# Patient Record
Sex: Male | Born: 1975
Health system: Southern US, Community
[De-identification: ages and names within clinical notes are randomized; demographics above are authoritative.]

## PROBLEM LIST (undated history)

## (undated) DIAGNOSIS — G809 Cerebral palsy, unspecified: Secondary | ICD-10-CM

## (undated) HISTORY — PX: FASCIAL DEFECT REPAIR: SHX865

## (undated) HISTORY — DX: Cerebral palsy, unspecified: G80.9

---

## 2013-10-09 ENCOUNTER — Encounter: Payer: Self-pay | Admitting: Family Medicine

## 2013-10-09 ENCOUNTER — Ambulatory Visit (INDEPENDENT_AMBULATORY_CARE_PROVIDER_SITE_OTHER): Payer: 59 | Admitting: Family Medicine

## 2013-10-09 VITALS — BP 135/93 | HR 89 | Ht 66.0 in | Wt 168.0 lb

## 2013-10-09 DIAGNOSIS — M545 Low back pain, unspecified: Secondary | ICD-10-CM | POA: Insufficient documentation

## 2013-10-09 DIAGNOSIS — G809 Cerebral palsy, unspecified: Secondary | ICD-10-CM

## 2013-10-09 DIAGNOSIS — R5382 Chronic fatigue, unspecified: Secondary | ICD-10-CM | POA: Insufficient documentation

## 2013-10-09 DIAGNOSIS — R5381 Other malaise: Secondary | ICD-10-CM

## 2013-10-09 DIAGNOSIS — R5383 Other fatigue: Secondary | ICD-10-CM

## 2013-10-09 HISTORY — DX: Cerebral palsy, unspecified: G80.9

## 2013-10-09 NOTE — Progress Notes (Signed)
CC: Jonathan Rivera is a 38 y.o. male is here for Establish Care and wants disability forms filled   Subjective: HPI:  Very pleasant 38 year old here to establish care  Patient complains of chronic fatigue has been present for matter of months it has not been getting better or worse it is persistent on a daily basis. He reports restorative sleep however within hours of casual daily activities he will be fatigue to the point where he has to nap. He denies difficulty falling or staying asleep. Describes symptoms as moderate in severity occurring anytime of the day. He denies snoring, and there have been no witnessed apneic episodes. Denies shortness of breath or chest pain  He is a history of cerebral palsy and is currently on disability. He has paperwork is requesting that I fill out for him today to help with student loan reimbursement.  He also brings in a evaluation confirming fine motor, gross motor and expressive language deficits that have been present since childhood.  He complains of midline low back pain that has been present for about a year but worsening over the last month. It is present on a daily basis described only has pain in nonradiating. Denies radiation, bowel or bladder incontinence or saddle paresthesia. Denies recent or remote trauma. He has lower extremity spasticity along with weakness due to cerebral palsy and notes that the more he walks with a slight stiff the worse his back pain is. No interventions as of yet and he takes no medications for this   Review of Systems - General ROS: negative for - chills, fever, night sweats, weight gain or weight loss Ophthalmic ROS: negative for - decreased vision Psychological ROS: negative for - anxiety or depression ENT ROS: negative for - hearing change, nasal congestion, tinnitus or allergies Hematological and Lymphatic ROS: negative for - bleeding problems, bruising or swollen lymph nodes Breast ROS: negative Respiratory ROS: no  cough, shortness of breath, or wheezing Cardiovascular ROS: no chest pain or dyspnea on exertion Gastrointestinal ROS: no abdominal pain, change in bowel habits, or black or bloody stools Genito-Urinary ROS: negative for - genital discharge, genital ulcers, incontinence or abnormal bleeding from genitals Musculoskeletal ROS: negative for -positive for bilateral ankle and knee pain Neurological ROS: negative for - headaches or memory loss Dermatological ROS: negative for lumps, mole changes, rash and skin lesion changes  Past Medical History  Diagnosis Date  . CP (cerebral palsy) 10/09/2013    Fine motor, gross motor, expressive language.     No past surgical history on file. No family history on file.  History   Social History  . Marital Status: Single    Spouse Name: N/A    Number of Children: N/A  . Years of Education: N/A   Occupational History  . Not on file.   Social History Main Topics  . Smoking status: Never Smoker   . Smokeless tobacco: Not on file  . Alcohol Use: 0.5 oz/week    1 drink(s) per week  . Drug Use: No  . Sexual Activity: Yes    Partners: Female   Other Topics Concern  . Not on file   Social History Narrative  . No narrative on file     Objective: BP 135/93  Pulse 89  Ht 5' 6"  (1.676 m)  Wt 168 lb (76.204 kg)  BMI 27.13 kg/m2  General: Alert and Oriented, No Acute Distress HEENT: Pupils equal, round, reactive to light. Conjunctivae clear.  External ears unremarkable, canals clear with intact  TMs with appropriate landmarks.  Middle ear appears open without effusion. Pink inferior turbinates.  Moist mucous membranes, pharynx without inflammation nor lesions.  Neck supple without palpable lymphadenopathy nor abnormal masses. Lungs: Clear to auscultation bilaterally, no wheezing/ronchi/rales.  Comfortable work of breathing. Good air movement. Cardiac: Regular rate and rhythm. Normal S1/S2.  No murmurs, rubs, nor gallops.   Back: No midline spinous  process tenderness or paraspinal muscular tenderness in the lumbar region. Significant straightening of the lumbar spine. Negative straight leg raise. L4 and S1 DTRs two over four bilaterally. Extremities: No peripheral edema.  Strong peripheral pulses. Moderate atrophy of the thigh and calf muscles bilaterally. For over 5 strength throughout all muscle groups in the legs distally to the foot Mental Status: No depression, anxiety, nor agitation. Skin: Warm and dry.  Assessment & Plan: [name] was seen today for establish care and wants disability forms filled.  Diagnoses and associated orders for this visit:  CP (cerebral palsy) - Ambulatory referral to Physical Therapy  Chronic fatigue - CBC - Vit D  25 hydroxy (rtn osteoporosis monitoring) - Comp Met (CMET) - B12  Midline low back pain without sciatica - Ambulatory referral to Physical Therapy    Cerebral palsy: Suspect that poor biomechanics secondary to cerebral palsy has been contributing to his low back pain, joint decision that is not maximized on his physical conditioning therefore referral to physical therapy to help maximize his independence. Of note he is dependent on others to help with dressing but independent of all other ADLs Chronic fatigue: Checking labs above Time was taken to fill out and review his disability paperwork in the presence of the patient  45 minutes spent face-to-face during visit today of which at least 50% was counseling or coordinating care regarding: 1. CP (cerebral palsy)   2. Chronic fatigue   3. Midline low back pain without sciatica      Return in about 4 weeks (around 11/06/2013).

## 2013-10-10 LAB — COMPREHENSIVE METABOLIC PANEL
ALT: 67 U/L — AB (ref 0–53)
AST: 26 U/L (ref 0–37)
Albumin: 4.7 g/dL (ref 3.5–5.2)
Alkaline Phosphatase: 87 U/L (ref 39–117)
BILIRUBIN TOTAL: 0.9 mg/dL (ref 0.2–1.2)
BUN: 13 mg/dL (ref 6–23)
CALCIUM: 9.6 mg/dL (ref 8.4–10.5)
CHLORIDE: 101 meq/L (ref 96–112)
CO2: 27 mEq/L (ref 19–32)
Creat: 0.81 mg/dL (ref 0.50–1.35)
GLUCOSE: 193 mg/dL — AB (ref 70–99)
POTASSIUM: 4.1 meq/L (ref 3.5–5.3)
SODIUM: 136 meq/L (ref 135–145)
TOTAL PROTEIN: 7.2 g/dL (ref 6.0–8.3)

## 2013-10-10 LAB — CBC
HEMATOCRIT: 44.6 % (ref 39.0–52.0)
HEMOGLOBIN: 15.4 g/dL (ref 13.0–17.0)
MCH: 28.5 pg (ref 26.0–34.0)
MCHC: 34.5 g/dL (ref 30.0–36.0)
MCV: 82.6 fL (ref 78.0–100.0)
Platelets: 244 10*3/uL (ref 150–400)
RBC: 5.4 MIL/uL (ref 4.22–5.81)
RDW: 13.9 % (ref 11.5–15.5)
WBC: 9.1 10*3/uL (ref 4.0–10.5)

## 2013-10-10 LAB — VITAMIN D 25 HYDROXY (VIT D DEFICIENCY, FRACTURES): VIT D 25 HYDROXY: 12 ng/mL — AB (ref 30–89)

## 2013-10-10 LAB — VITAMIN B12: Vitamin B-12: 580 pg/mL (ref 211–911)

## 2013-10-12 ENCOUNTER — Encounter: Payer: Self-pay | Admitting: Family Medicine

## 2013-10-12 ENCOUNTER — Telehealth: Payer: Self-pay | Admitting: Family Medicine

## 2013-10-12 DIAGNOSIS — E559 Vitamin D deficiency, unspecified: Secondary | ICD-10-CM | POA: Insufficient documentation

## 2013-10-12 DIAGNOSIS — R739 Hyperglycemia, unspecified: Secondary | ICD-10-CM

## 2013-10-12 MED ORDER — VITAMIN D (ERGOCALCIFEROL) 1.25 MG (50000 UNIT) PO CAPS: 50000.0000 [IU] | ORAL_CAPSULE | ORAL | Status: DC

## 2013-10-12 NOTE — Telephone Encounter (Signed)
Jonathan Rivera, Will you please let patient know that his labs were significant for a Vitamin D deficiency along with an elevated blood sugar both of which could be the cause of his fatigue.  I've sent in a Rx for a weekly Vitamin D supplement to [facility] and I'd recommend that he return for a lab only visit to have an A1c average blood sugar check to look for diabetes. (lab in your inbox)

## 2013-10-12 NOTE — Telephone Encounter (Signed)
Pt notified and labs faxed 

## 2013-10-28 ENCOUNTER — Ambulatory Visit (INDEPENDENT_AMBULATORY_CARE_PROVIDER_SITE_OTHER): Payer: 59 | Admitting: Physical Therapy

## 2013-10-28 DIAGNOSIS — M25579 Pain in unspecified ankle and joints of unspecified foot: Secondary | ICD-10-CM

## 2013-10-28 DIAGNOSIS — M545 Low back pain, unspecified: Secondary | ICD-10-CM

## 2013-10-28 DIAGNOSIS — R5381 Other malaise: Secondary | ICD-10-CM

## 2013-10-28 DIAGNOSIS — G809 Cerebral palsy, unspecified: Secondary | ICD-10-CM

## 2013-10-28 DIAGNOSIS — M2560 Stiffness of unspecified joint, not elsewhere classified: Secondary | ICD-10-CM

## 2013-10-29 ENCOUNTER — Telehealth: Payer: Self-pay | Admitting: Family Medicine

## 2013-10-29 DIAGNOSIS — E119 Type 2 diabetes mellitus without complications: Secondary | ICD-10-CM

## 2013-10-29 LAB — HEMOGLOBIN A1C
Hgb A1c MFr Bld: 8.5 % — ABNORMAL HIGH (ref <5.7)
Mean Plasma Glucose: 197 mg/dL — ABNORMAL HIGH (ref <117)

## 2013-10-29 MED ORDER — METFORMIN HCL 1000 MG PO TABS: ORAL_TABLET | ORAL | Status: DC

## 2013-10-29 NOTE — Telephone Encounter (Signed)
[name], Will you please let patient know that his A1c average blood sugar test is in the diabetic range.  To help better control this and prevent complications I'd recommend he start a daily dose of metformin which I've [facility].  We'll want to recheck this along with his Vitamin D in late November.

## 2013-10-29 NOTE — Telephone Encounter (Signed)
Pt.notified

## 2013-11-09 ENCOUNTER — Encounter (INDEPENDENT_AMBULATORY_CARE_PROVIDER_SITE_OTHER): Payer: 59 | Admitting: Physical Therapy

## 2013-11-09 DIAGNOSIS — G809 Cerebral palsy, unspecified: Secondary | ICD-10-CM

## 2013-11-09 DIAGNOSIS — M545 Low back pain, unspecified: Secondary | ICD-10-CM

## 2013-11-09 DIAGNOSIS — M25579 Pain in unspecified ankle and joints of unspecified foot: Secondary | ICD-10-CM

## 2013-11-09 DIAGNOSIS — M2560 Stiffness of unspecified joint, not elsewhere classified: Secondary | ICD-10-CM

## 2013-11-09 DIAGNOSIS — R5381 Other malaise: Secondary | ICD-10-CM

## 2013-11-12 ENCOUNTER — Encounter (INDEPENDENT_AMBULATORY_CARE_PROVIDER_SITE_OTHER): Payer: 59 | Admitting: Physical Therapy

## 2013-11-12 DIAGNOSIS — M2560 Stiffness of unspecified joint, not elsewhere classified: Secondary | ICD-10-CM

## 2013-11-12 DIAGNOSIS — M25579 Pain in unspecified ankle and joints of unspecified foot: Secondary | ICD-10-CM

## 2013-11-12 DIAGNOSIS — G809 Cerebral palsy, unspecified: Secondary | ICD-10-CM

## 2013-11-12 DIAGNOSIS — M545 Low back pain, unspecified: Secondary | ICD-10-CM

## 2013-11-12 DIAGNOSIS — R5381 Other malaise: Secondary | ICD-10-CM

## 2013-11-16 ENCOUNTER — Encounter (INDEPENDENT_AMBULATORY_CARE_PROVIDER_SITE_OTHER): Payer: 59 | Admitting: Physical Therapy

## 2013-11-16 DIAGNOSIS — M25579 Pain in unspecified ankle and joints of unspecified foot: Secondary | ICD-10-CM

## 2013-11-16 DIAGNOSIS — G809 Cerebral palsy, unspecified: Secondary | ICD-10-CM

## 2013-11-16 DIAGNOSIS — R5381 Other malaise: Secondary | ICD-10-CM

## 2013-11-16 DIAGNOSIS — M2560 Stiffness of unspecified joint, not elsewhere classified: Secondary | ICD-10-CM

## 2013-11-16 DIAGNOSIS — M545 Low back pain, unspecified: Secondary | ICD-10-CM

## 2013-11-19 ENCOUNTER — Encounter: Payer: 59 | Admitting: Physical Therapy

## 2013-11-23 ENCOUNTER — Encounter: Payer: 59 | Admitting: Physical Therapy

## 2013-11-26 ENCOUNTER — Encounter: Payer: 59 | Admitting: Physical Therapy

## 2013-11-30 ENCOUNTER — Encounter: Payer: Self-pay | Admitting: Physician Assistant

## 2013-11-30 ENCOUNTER — Encounter (INDEPENDENT_AMBULATORY_CARE_PROVIDER_SITE_OTHER): Payer: 59 | Admitting: Physical Therapy

## 2013-11-30 ENCOUNTER — Ambulatory Visit (INDEPENDENT_AMBULATORY_CARE_PROVIDER_SITE_OTHER): Payer: 59 | Admitting: Physician Assistant

## 2013-11-30 VITALS — BP 128/95 | HR 95

## 2013-11-30 DIAGNOSIS — M545 Low back pain, unspecified: Secondary | ICD-10-CM

## 2013-11-30 DIAGNOSIS — M62830 Muscle spasm of back: Secondary | ICD-10-CM

## 2013-11-30 DIAGNOSIS — M2560 Stiffness of unspecified joint, not elsewhere classified: Secondary | ICD-10-CM

## 2013-11-30 DIAGNOSIS — M25579 Pain in unspecified ankle and joints of unspecified foot: Secondary | ICD-10-CM

## 2013-11-30 DIAGNOSIS — G809 Cerebral palsy, unspecified: Secondary | ICD-10-CM

## 2013-11-30 MED ORDER — MELOXICAM 15 MG PO TABS: 15.0000 mg | ORAL_TABLET | Freq: Every day | ORAL | Status: DC

## 2013-11-30 MED ORDER — TRAMADOL HCL 50 MG PO TABS
50.0000 mg | ORAL_TABLET | Freq: Three times a day (TID) | ORAL | Status: DC | PRN
Start: 2013-11-30 — End: 2017-10-24

## 2013-11-30 MED ORDER — KETOROLAC TROMETHAMINE 60 MG/2ML IM SOLN
60.0000 mg | Freq: Once | INTRAMUSCULAR | Status: AC
  Administered 2013-11-30: 60 mg via INTRAMUSCULAR

## 2013-11-30 MED ORDER — CYCLOBENZAPRINE HCL 10 MG PO TABS: 10.0000 mg | ORAL_TABLET | Freq: Three times a day (TID) | ORAL | Status: DC | PRN

## 2013-11-30 NOTE — Patient Instructions (Signed)
Toradol in office today.  Flexeril as needed up to three times a day.  Mobic daily for next week.  Tramadol as needed for pain.  Heat and ice alternate.  ROM exercise given by PT.

## 2013-11-30 NOTE — Progress Notes (Signed)
   Subjective:    Patient ID: Jonathan Rivera, male    DOB: 11/27/1975, 38 y.o.   MRN: [id number]  HPI Pt presents to the clinic with lower midline back pain that worsened this am about 12. He has been in PT for one month with chronic midline back pain without sciatica. He does feel like this was helping. This am he sat up and bed and felt something "twist" and acute pain followed. Rated pain last night 9/10. Now rates 7/10. Movement makes worse. He has to use cane and/or wheelchair to get around. Feels better to lay in fetal position or lean forward. Denies any radiation of pain down the backs of legs but does have some muscle tightness down the front of his leg. No trauma.    Review of Systems  All other systems reviewed and are negative.      Objective:   Physical Exam  Constitutional: He is oriented to person, place, and time. He appears well-developed and well-nourished.  HENT:  Head: Normocephalic and atraumatic.  Musculoskeletal:  ROM at waist limited due to pain.  Pain better leaning forward.  No pain over spine to palpation.  Lumbar Paraspinous muscles tight and tender.  Not able to perform straight leg test due to pain.  Not able to balance on one leg.   Neurological: He is alert and oriented to person, place, and time.  Skin: Skin is dry.  Psychiatric: He has a normal mood and affect. His behavior is normal.          Assessment & Plan:  Muscle spasm of back/midline low back pain without scaiatica- pt had tremendously tight lower Paraspinous muscles. Toradol 60mg  IM given in office today. Flexeril to use up to three times a day given. Sedation warning discussed. mobic to take daily for 1-2 weeks.(pt requested once a day because hates taking medication). Tramadol given for acute pain #30. Ice and heat alternated encouraged. Follow up as needed or if pain worsening. Discussed with pt since no trauma or red flag signs then no imaging needed at this point. Follow up with PCP for  further management. Continue with PT.

## 2013-12-03 ENCOUNTER — Encounter (INDEPENDENT_AMBULATORY_CARE_PROVIDER_SITE_OTHER): Payer: 59

## 2013-12-03 DIAGNOSIS — G809 Cerebral palsy, unspecified: Secondary | ICD-10-CM

## 2013-12-03 DIAGNOSIS — M2560 Stiffness of unspecified joint, not elsewhere classified: Secondary | ICD-10-CM

## 2013-12-03 DIAGNOSIS — M542 Cervicalgia: Secondary | ICD-10-CM

## 2013-12-03 DIAGNOSIS — M25579 Pain in unspecified ankle and joints of unspecified foot: Secondary | ICD-10-CM

## 2013-12-03 DIAGNOSIS — R5381 Other malaise: Secondary | ICD-10-CM

## 2013-12-10 ENCOUNTER — Encounter: Payer: 59 | Admitting: Physical Therapy

## 2013-12-17 ENCOUNTER — Encounter (INDEPENDENT_AMBULATORY_CARE_PROVIDER_SITE_OTHER): Payer: 59 | Admitting: Physical Therapy

## 2013-12-17 DIAGNOSIS — M542 Cervicalgia: Secondary | ICD-10-CM

## 2013-12-17 DIAGNOSIS — M25579 Pain in unspecified ankle and joints of unspecified foot: Secondary | ICD-10-CM

## 2013-12-17 DIAGNOSIS — M2560 Stiffness of unspecified joint, not elsewhere classified: Secondary | ICD-10-CM

## 2013-12-17 DIAGNOSIS — R5381 Other malaise: Secondary | ICD-10-CM

## 2013-12-17 DIAGNOSIS — M549 Dorsalgia, unspecified: Secondary | ICD-10-CM

## 2013-12-17 DIAGNOSIS — G809 Cerebral palsy, unspecified: Secondary | ICD-10-CM

## 2013-12-31 ENCOUNTER — Encounter (INDEPENDENT_AMBULATORY_CARE_PROVIDER_SITE_OTHER): Payer: 59 | Admitting: Physical Therapy

## 2013-12-31 DIAGNOSIS — M545 Low back pain: Secondary | ICD-10-CM

## 2013-12-31 DIAGNOSIS — M25579 Pain in unspecified ankle and joints of unspecified foot: Secondary | ICD-10-CM

## 2013-12-31 DIAGNOSIS — G809 Cerebral palsy, unspecified: Secondary | ICD-10-CM

## 2013-12-31 DIAGNOSIS — R5381 Other malaise: Secondary | ICD-10-CM

## 2013-12-31 DIAGNOSIS — M2560 Stiffness of unspecified joint, not elsewhere classified: Secondary | ICD-10-CM

## 2014-01-14 ENCOUNTER — Encounter: Payer: 59 | Admitting: Physical Therapy

## 2014-01-14 ENCOUNTER — Encounter (INDEPENDENT_AMBULATORY_CARE_PROVIDER_SITE_OTHER): Payer: 59 | Admitting: Physical Therapy

## 2014-01-14 DIAGNOSIS — M2560 Stiffness of unspecified joint, not elsewhere classified: Secondary | ICD-10-CM

## 2014-01-14 DIAGNOSIS — G809 Cerebral palsy, unspecified: Secondary | ICD-10-CM

## 2014-01-14 DIAGNOSIS — M25579 Pain in unspecified ankle and joints of unspecified foot: Secondary | ICD-10-CM

## 2014-01-14 DIAGNOSIS — M545 Low back pain: Secondary | ICD-10-CM

## 2014-01-14 DIAGNOSIS — R5381 Other malaise: Secondary | ICD-10-CM

## 2014-02-03 ENCOUNTER — Other Ambulatory Visit: Payer: Self-pay | Admitting: Family Medicine

## 2014-02-03 NOTE — Telephone Encounter (Signed)
Dr. Ivan AnchorsHommel do you want Vitamin D level prior to this refill? Corliss SkainsJamie Natasa Stigall, CMA

## 2014-02-03 NOTE — Telephone Encounter (Signed)
Yes he's due for a Vit D check and also a f/u appt with me.

## 2017-10-03 ENCOUNTER — Encounter: Payer: Self-pay | Admitting: Osteopathic Medicine

## 2017-10-03 ENCOUNTER — Ambulatory Visit (INDEPENDENT_AMBULATORY_CARE_PROVIDER_SITE_OTHER): Payer: Medicare HMO | Admitting: Osteopathic Medicine

## 2017-10-03 VITALS — BP 141/99 | HR 90 | Temp 98.1°F | Ht 67.0 in | Wt 160.0 lb

## 2017-10-03 DIAGNOSIS — R7989 Other specified abnormal findings of blood chemistry: Secondary | ICD-10-CM | POA: Diagnosis not present

## 2017-10-03 DIAGNOSIS — Z0000 Encounter for general adult medical examination without abnormal findings: Secondary | ICD-10-CM | POA: Diagnosis not present

## 2017-10-03 DIAGNOSIS — R7309 Other abnormal glucose: Secondary | ICD-10-CM

## 2017-10-03 DIAGNOSIS — Z8669 Personal history of other diseases of the nervous system and sense organs: Secondary | ICD-10-CM | POA: Diagnosis not present

## 2017-10-03 DIAGNOSIS — R030 Elevated blood-pressure reading, without diagnosis of hypertension: Secondary | ICD-10-CM | POA: Diagnosis not present

## 2017-10-03 DIAGNOSIS — Z113 Encounter for screening for infections with a predominantly sexual mode of transmission: Secondary | ICD-10-CM | POA: Diagnosis not present

## 2017-10-03 NOTE — Progress Notes (Signed)
HPI: Jonathan Rivera is a 42 y.o. male who  has a past medical history of CP (cerebral palsy) (HCC) (10/09/2013).  he presents to [facility] today, 10/03/17,  for chief complaint of: Establish care Cerebral palsy History diabetes    Previously seen by Dr Ivan Anchors and Lesly Rubenstein 2015.  Was never told about diabetes-range sugars that he can recall.   Concerned about preventive care since he just turned 42 years old. No other major concerns right now.   There is no immunization history on file for this patient.    Past medical, surgical, social and family history reviewed:  Patient Active Problem List   Diagnosis Date Noted  . Type 2 diabetes mellitus (HCC) 10/29/2013  . Vitamin D deficiency 10/12/2013  . CP (cerebral palsy) (HCC) 10/09/2013  . Midline low back pain without sciatica 10/09/2013  . Chronic fatigue 10/09/2013    Past Surgical History:  Procedure Laterality Date  . FASCIAL DEFECT REPAIR      Social History   Tobacco Use  . Smoking status: Never Smoker  . Smokeless tobacco: Never Used  Substance Use Topics  . Alcohol use: Yes    Alcohol/week: 1.0 standard drinks    Types: 1 Standard drinks or equivalent per week    Family History  Problem Relation Age of Onset  . Stroke Mother   . Diabetes Father      Current medication list and allergy/intolerance information reviewed:    Current Outpatient Medications  Medication Sig Dispense Refill  . cyclobenzaprine (FLEXERIL) 10 MG tablet Take 1 tablet (10 mg total) by mouth 3 (three) times daily as needed for muscle spasms. (Patient not taking: Reported on 10/03/2017) 30 tablet 0  . meloxicam (MOBIC) 15 MG tablet Take 1 tablet (15 mg total) by mouth daily. (Patient not taking: Reported on 10/03/2017) 30 tablet 0  . metFORMIN (GLUCOPHAGE) 1000 MG tablet One tablet by mouth every evening for blood sugar control. 30 tablet 2  . traMADol (ULTRAM) 50 MG tablet Take 1 tablet (50 mg total) by  mouth every 8 (eight) hours as needed. (Patient not taking: Reported on 10/03/2017) 30 tablet 0  . Vitamin D, Ergocalciferol, (DRISDOL) 50000 UNITS CAPS capsule Take 1 capsule (50,000 Units total) by mouth every 7 (seven) days. Recheck Vitamin D in 3 Months (Patient not taking: Reported on 10/03/2017) 12 capsule 0   No current facility-administered medications for this visit.     No Known Allergies    Review of Systems:  Constitutional:  No  fever, no chills, No recent illness, No unintentional weight changes. No significant fatigue.   HEENT: No  headache, no vision change, no hearing change, No sore throat, No  sinus pressure  Cardiac: No  chest pain, No  pressure, No palpitations, No  Orthopnea  Respiratory:  No  shortness of breath. No  Cough  Gastrointestinal: No  abdominal pain, No  nausea, No  vomiting,  No  blood in stool, No  diarrhea, No  constipation   Musculoskeletal: No new myalgia/arthralgia  Skin: No  Rash, No other wounds/concerning lesions  Genitourinary: No  incontinence, No  abnormal genital bleeding, No abnormal genital discharge  Hem/Onc: No  easy bruising/bleeding, No  abnormal lymph node  Endocrine: No cold intolerance,  No heat intolerance. No polyuria/polydipsia/polyphagia   Neurologic: No  weakness, No  dizziness, No  slurred speech/focal weakness/facial droop  Psychiatric: No  concerns with depression, No  concerns with anxiety, No sleep problems, No mood problems  Exam:  BP (!) 141/99 (BP Location: Left Arm, Patient Position: Sitting, Cuff Size: Normal)   Pulse 90   Temp 98.1 F (36.7 C) (Oral)   Ht 5\' 7"  (1.702 m)   Wt 160 lb 0.6 oz (72.6 kg)   BMI 25.07 kg/m   Constitutional: VS see above. General Appearance: alert, well-developed, well-nourished, NAD  Eyes: Normal lids and conjunctive, non-icteric sclera  Ears, Nose, Mouth, Throat: MMM, Normal external inspection ears/nares/mouth/lips/gums. TM normal bilaterally. Pharynx/tonsils no erythema,  no exudate. Nasal mucosa normal.   Neck: No masses, trachea midline. No thyroid enlargement. No tenderness/mass appreciated. No lymphadenopathy  Respiratory: Normal respiratory effort. no wheeze, no rhonchi, no rales  Cardiovascular: S1/S2 normal, no murmur, no rub/gallop auscultated. RRR. No lower extremity edema. Pedal pulse II/IV bilaterally DP and PT. No carotid bruit or JVD. No abdominal aortic bruit.  Gastrointestinal: Nontender, no masses. No hepatomegaly, no splenomegaly. No hernia appreciated. Bowel sounds normal. Rectal exam deferred.   Musculoskeletal: Gait normal. No clubbing/cyanosis of digits. Clawed a bit on R hand.   Neurological: Normal balance/coordination. No tremor. No cranial nerve deficit on limited exam. Motor and sensation intact and symmetric. Cerebellar reflexes intact.   Skin: warm, dry, intact. No rash/ulcer. No concerning nevi or subq nodules on limited exam.    Psychiatric: Normal judgment/insight. Flat-ish but pleasant mood and affect. Oriented x3.    No results found for this or any previous visit (from the past 72 hour(s)).  No results found.   ASSESSMENT/PLAN: The primary encounter diagnosis was Annual physical exam. Diagnoses of History of cerebral palsy, Elevated hemoglobin A1c, Routine screening for STI (sexually transmitted infection), and Elevated blood pressure reading were also pertinent to this visit.   Will recheck A1C...  Follow up on BP, possibly white coat   Orders Placed This Encounter  Procedures  . C. trachomatis/N. gonorrhoeae RNA  . CBC  . COMPLETE METABOLIC PANEL WITH GFR  . TSH  . Lipid panel  . Hemoglobin A1c  . HIV antibody  . Hepatitis C antibody  . Hepatitis B core antibody, total  . Hepatitis B surface antigen  . RPR      Patient Instructions  General Preventive Care  Most recent routine screening lipids/other labs: get labs today   Tobacco: don't! Alcohol: moderation is ok for most people.  Recreational/Illicit Drugs: don't!  Exercise: as tolerated to reduce risk of cardiovascular disease and diabetes  Mental health: if need for mental health care (medicines, counseling, other), or concerns about moods, please let me know!   Vaccines  Flu vaccine: recommended every fall (by Halloween!)  Shingles vaccine: Shingrix recommended after age 18  Pneumonia vaccines: Prevnar and Pneumovax recommended after age 37, sooner if diabetes, COPD/asthma, others  Tetanus booster: Tdap recommended every 10 years - we can do that in the office if desired   Cancer screenings   Colon cancer screening: recommended at age 71, colonoscopy sooner if risk factors   Prostate cancer screening: recommendations vary, optional PSA blood test for men around age 35  Infection screenings . HIV: recommended screening at least once age 58-65, more often if risk factors  . Gonorrhea/Chlamydia: many insurances require testing for anyone on birth control pills, otherwise screening as needed . Hepatitis C: recommended for anyone born 43-1964  Other . Bone Density Test: recommended for men at age 48, sooner depending on risk factors . Advanced Directive: Living Will and/or Healthcare Power of Attorney recommended for everyone, regardless of age or health . Cholesterol & DIabetes:  recommended screening annually         Visit summary with medication list and pertinent instructions was printed for patient to review. All questions at time of visit were answered - patient instructed to contact office with any additional concerns. ER/RTC precautions were reviewed with the patient.   Follow-up plan: Return for review labs if anything abnormal - 1-2 weeks .    Please note: voice recognition software was used to produce this document, and typos may escape review. Please contact Dr. Lyn HollingsheadAlexander for any needed clarifications.

## 2017-10-03 NOTE — Patient Instructions (Addendum)
General Preventive Care  Most recent routine screening lipids/other labs: get labs today   Tobacco: don't! Alcohol: moderation is ok for most people. Recreational/Illicit Drugs: don't!  Exercise: as tolerated to reduce risk of cardiovascular disease and diabetes  Mental health: if need for mental health care (medicines, counseling, other), or concerns about moods, please let me know!   Vaccines  Flu vaccine: recommended every fall (by Halloween!)  Shingles vaccine: Shingrix recommended after age 41  Pneumonia vaccines: Prevnar and Pneumovax recommended after age 41, sooner if diabetes, COPD/asthma, others  Tetanus booster: Tdap recommended every 10 years - we can do that in the office if desired   Cancer screenings   Colon cancer screening: recommended at age 41, colonoscopy sooner if risk factors   Prostate cancer screening: recommendations vary, optional PSA blood test for men around age 41  Infection screenings . HIV: recommended screening at least once age 41-65, more often if risk factors  . Gonorrhea/Chlamydia: many insurances require testing for anyone on birth control pills, otherwise screening as needed . Hepatitis C: recommended for anyone born [id number]  Other . Bone Density Test: recommended for men at age 370, sooner depending on risk factors . Advanced Directive: Living Will and/or Healthcare Power of Attorney recommended for everyone, regardless of age or health . Cholesterol & DIabetes: recommended screening annually

## 2017-10-04 LAB — C. TRACHOMATIS/N. GONORRHOEAE RNA
C. TRACHOMATIS RNA, TMA: NOT DETECTED
N. GONORRHOEAE RNA, TMA: NOT DETECTED

## 2017-10-04 LAB — CBC
HCT: 44.5 % (ref 38.5–50.0)
Hemoglobin: 15 g/dL (ref 13.2–17.1)
MCH: 28.2 pg (ref 27.0–33.0)
MCHC: 33.7 g/dL (ref 32.0–36.0)
MCV: 83.8 fL (ref 80.0–100.0)
MPV: 11.9 fL (ref 7.5–12.5)
PLATELETS: 227 10*3/uL (ref 140–400)
RBC: 5.31 10*6/uL (ref 4.20–5.80)
RDW: 12.7 % (ref 11.0–15.0)
WBC: 8.8 10*3/uL (ref 3.8–10.8)

## 2017-10-04 LAB — HEMOGLOBIN A1C
Hgb A1c MFr Bld: 10.2 % of total Hgb — ABNORMAL HIGH (ref <5.7)
Mean Plasma Glucose: 246 (calc)
eAG (mmol/L): 13.6 (calc)

## 2017-10-04 LAB — HEPATITIS B CORE ANTIBODY, TOTAL: HEP B C TOTAL AB: NONREACTIVE

## 2017-10-04 LAB — COMPLETE METABOLIC PANEL WITH GFR
AG Ratio: 1.9 (calc) (ref 1.0–2.5)
ALKALINE PHOSPHATASE (APISO): 84 U/L (ref 40–115)
ALT: 42 U/L (ref 9–46)
AST: 21 U/L (ref 10–40)
Albumin: 4.8 g/dL (ref 3.6–5.1)
BUN: 17 mg/dL (ref 7–25)
CHLORIDE: 102 mmol/L (ref 98–110)
CO2: 28 mmol/L (ref 20–32)
Calcium: 9.9 mg/dL (ref 8.6–10.3)
Creat: 1 mg/dL (ref 0.60–1.35)
GFR, Est African American: 109 mL/min/{1.73_m2} (ref >=60)
GFR, Est Non African American: 94 mL/min/{1.73_m2} (ref >=60)
GLUCOSE: 221 mg/dL — AB (ref 65–139)
Globulin: 2.5 g/dL (calc) (ref 1.9–3.7)
Potassium: 4.4 mmol/L (ref 3.5–5.3)
Sodium: 139 mmol/L (ref 135–146)
Total Bilirubin: 0.8 mg/dL (ref 0.2–1.2)
Total Protein: 7.3 g/dL (ref 6.1–8.1)

## 2017-10-04 LAB — TSH: TSH: 1.79 m[IU]/L (ref 0.40–4.50)

## 2017-10-04 LAB — LIPID PANEL
Cholesterol: 190 mg/dL (ref <200)
HDL: 37 mg/dL — AB (ref >40)
LDL Cholesterol (Calc): 116 mg/dL (calc) — ABNORMAL HIGH
Non-HDL Cholesterol (Calc): 153 mg/dL (calc) — ABNORMAL HIGH (ref <130)
TRIGLYCERIDES: 262 mg/dL — AB (ref <150)
Total CHOL/HDL Ratio: 5.1 (calc) — ABNORMAL HIGH (ref <5.0)

## 2017-10-04 LAB — HEPATITIS C ANTIBODY
Hepatitis C Ab: NONREACTIVE
SIGNAL TO CUT-OFF: 0.01 (ref <1.00)

## 2017-10-04 LAB — HIV ANTIBODY (ROUTINE TESTING W REFLEX): HIV 1&2 Ab, 4th Generation: NONREACTIVE

## 2017-10-04 LAB — RPR: RPR: NONREACTIVE

## 2017-10-04 LAB — HEPATITIS B SURFACE ANTIGEN: HEP B S AG: NONREACTIVE

## 2017-10-24 ENCOUNTER — Ambulatory Visit (INDEPENDENT_AMBULATORY_CARE_PROVIDER_SITE_OTHER): Payer: Medicare HMO | Admitting: Osteopathic Medicine

## 2017-10-24 ENCOUNTER — Encounter: Payer: Self-pay | Admitting: Osteopathic Medicine

## 2017-10-24 VITALS — BP 147/97 | HR 84 | Temp 98.2°F | Wt 163.6 lb

## 2017-10-24 DIAGNOSIS — E1165 Type 2 diabetes mellitus with hyperglycemia: Secondary | ICD-10-CM

## 2017-10-24 MED ORDER — METFORMIN HCL ER 500 MG PO TB24: ORAL_TABLET | ORAL | 0 refills | Status: DC

## 2017-10-24 MED ORDER — BLOOD GLUCOSE MONITOR KIT: PACK | 1 refills | Status: AC

## 2017-10-24 NOTE — Patient Instructions (Signed)
Blood Glucose Monitoring, Adult Monitoring your blood sugar (glucose) helps you manage your diabetes. It also helps you and your health care provider determine how well your diabetes management plan is working. Blood glucose monitoring involves checking your blood glucose as often as directed, and keeping a record (log) of your results over time. Why should I monitor my blood glucose? Checking your blood glucose regularly can:  Help you understand how food, exercise, illnesses, and medicines affect your blood glucose.  Let you know what your blood glucose is at any time. You can quickly tell if you are having low blood glucose (hypoglycemia) or high blood glucose (hyperglycemia).  Help you and your health care provider adjust your medicines as needed.  When should I check my blood glucose? Follow instructions from your health care provider about how often to check your blood glucose. This may depend on:  The type of diabetes you have.  How well-controlled your diabetes is.  Medicines you are taking.  If you have type 2 diabetes:  If you take insulin or other diabetes medicines, check your blood glucose at least once per day, fasting  If you are on intensive insulin therapy, check your blood glucose at least 4 times a day. Occasionally, you may also need to check between 2:00 a.m. and 3:00 a.m., as directed.  Also check your blood glucose: ? Before and after exercise. ? Before potentially dangerous tasks, like driving or using heavy machinery.  You may need to check your blood glucose more often if: ? Your medicine is being adjusted. ? Your diabetes is not well-controlled. ? You are ill.  What is a blood glucose log?  A blood glucose log is a record of your blood glucose readings. It helps you and your health care provider: ? Look for patterns in your blood glucose over time. ? Adjust your diabetes management plan as needed.  Every time you check your blood glucose, write down  your result and notes about things that may be affecting your blood glucose, such as your diet and exercise for the day.  Most glucose meters store a record of glucose readings in the meter. Some meters allow you to download your records to a computer. How do I check my blood glucose? Follow these steps to get accurate readings of your blood glucose: Supplies needed   Blood glucose meter.  Test strips for your meter. Each meter has its own strips. You must use the strips that come with your meter.  A needle to prick your finger (lancet). Do not use lancets more than once.  A device that holds the lancet (lancing device).  A journal or log book to write down your results. Procedure  Wash your hands with soap and water.  Prick the side of your finger (not the tip) with the lancet. Use a different finger each time.  Gently rub the finger until a small drop of blood appears.  Follow instructions that come with your meter for inserting the test strip, applying blood to the strip, and using your blood glucose meter.  Write down your result and any notes. Alternative testing sites  Some meters allow you to use areas of your body other than your finger (alternative sites) to test your blood.  If you think you may have hypoglycemia, or if you have hypoglycemia unawareness, do not use alternative sites. Use your finger instead.  Alternative sites may not be as accurate as the fingers, because blood flow is slower in these areas.  This means that the result you get may be delayed, and it may be different from the result that you would get from your finger.  The most common alternative sites are: ? Forearm. ? Thigh. ? Palm of the hand. Additional tips  Always keep your supplies with you.  If you have questions or need help, all blood glucose meters have a 24-hour "hotline" number that you can call. You may also contact your health care provider.  After you use a few boxes of test  strips, adjust (calibrate) your blood glucose meter by following instructions that came with your meter. This information is not intended to replace advice given to you by your health care provider. Make sure you discuss any questions you have with your health care provider. Document Released: 02/15/2003 Document Revised: 09/02/2015 Document Reviewed: 07/25/2015 Elsevier Interactive Patient Education  2017 [employer]    Diabetes Mellitus and Exercise Exercising regularly is important for your overall health, especially when you have diabetes (diabetes mellitus). Exercising is not only about losing weight. It has many health benefits, such as increasing muscle strength and bone density and reducing body fat and stress. This leads to improved fitness, flexibility, and endurance, all of which result in better overall health. Exercise has additional benefits for people with diabetes, including:  Reducing appetite.  Helping to lower and control blood glucose.  Lowering blood pressure.  Helping to control amounts of fatty substances (lipids) in the blood, such as cholesterol and triglycerides.  Helping the body to respond better to insulin (improving insulin sensitivity).  Reducing how much insulin the body needs.  Decreasing the risk for heart disease by: ? Lowering cholesterol and triglyceride levels. ? Increasing the levels of good cholesterol. ? Lowering blood glucose levels.  What is my activity plan? Your health care provider or certified diabetes educator can help you make a plan for the type and frequency of exercise (activity plan) that works for you. Make sure that you:  Do at least 150 minutes of moderate-intensity or vigorous-intensity exercise each week. This could be brisk walking, biking, or water aerobics. ? Do stretching and strength exercises, such as yoga or weightlifting, at least 2 times a week. ? Spread out your activity over at least 3 days of the week.  Get  some form of physical activity every day. ? Do not go more than 2 days in a row without some kind of physical activity. ? Avoid being inactive for more than 90 minutes at a time. Take frequent breaks to walk or stretch.  Choose a type of exercise or activity that you enjoy, and set realistic goals.  Start slowly, and gradually increase the intensity of your exercise over time.  What do I need to know about managing my diabetes?  Know the symptoms of low blood glucose (hypoglycemia) and how to treat it. Your risk for hypoglycemia increases during and after exercise. Common symptoms of hypoglycemia can include: ? Hunger. ? Anxiety. ? Sweating and feeling clammy. ? Confusion. ? Dizziness or feeling light-headed. ? Increased heart rate or palpitations. ? Blurry vision. ? Tingling or numbness around the mouth, lips, or tongue. ? Tremors or shakes. ? Irritability.  Keep a rapid-acting carbohydrate snack available before, during, and after exercise to help prevent or treat hypoglycemia.  Avoid injecting insulin into areas of the body that are going to be exercised. For example, avoid injecting insulin into: ? The arms, when playing tennis. ? The legs, when jogging.  Keep records of your  exercise habits. Doing this can help you and your health care provider adjust your diabetes management plan as needed. Write down: ? Food that you eat before and after you exercise. ? Blood glucose levels before and after you exercise. ? The type and amount of exercise you have done. ? When your insulin is expected to peak, if you use insulin. Avoid exercising at times when your insulin is peaking.  When you start a new exercise or activity, work with your health care provider to make sure the activity is safe for you, and to adjust your insulin, medicines, or food intake as needed.  Drink plenty of water while you exercise to prevent dehydration or heat stroke. Drink enough fluid to keep your urine clear  or pale yellow. This information is not intended to replace advice given to you by your health care provider. Make sure you discuss any questions you have with your health care provider. Document Released: 05/05/2003 Document Revised: 09/02/2015 Document Reviewed: 07/25/2015 Elsevier Interactive Patient Education  2018 [employer].    Diabetes Mellitus and Nutrition When you have diabetes (diabetes mellitus), it is very important to have healthy eating habits because your blood sugar (glucose) levels are greatly affected by what you eat and drink. Eating healthy foods in the appropriate amounts, at about the same times every day, can help you:  Control your blood glucose.  Lower your risk of heart disease.  Improve your blood pressure.  Reach or maintain a healthy weight.  Every person with diabetes is different, and each person has different needs for a meal plan. Your health care provider may recommend that you work with a diet and nutrition specialist (dietitian) to make a meal plan that is best for you. Your meal plan may vary depending on factors such as:  The calories you need.  The medicines you take.  Your weight.  Your blood glucose, blood pressure, and cholesterol levels.  Your activity level.  Other health conditions you have, such as heart or kidney disease.  How do carbohydrates affect me? Carbohydrates affect your blood glucose level more than any other type of food. Eating carbohydrates naturally increases the amount of glucose in your blood. Carbohydrate counting is a method for keeping track of how many carbohydrates you eat. Counting carbohydrates is important to keep your blood glucose at a healthy level, especially if you use insulin or take certain oral diabetes medicines. It is important to know how many carbohydrates you can safely have in each meal. This is different for every person. Your dietitian can help you calculate how many carbohydrates you should  have at each meal and for snack. Foods that contain carbohydrates include:  Bread, cereal, rice, pasta, and crackers.  Potatoes and corn.  Peas, beans, and lentils.  Milk and yogurt.  Fruit and juice.  Desserts, such as cakes, cookies, ice cream, and candy.  How does alcohol affect me? Alcohol can cause a sudden decrease in blood glucose (hypoglycemia), especially if you use insulin or take certain oral diabetes medicines. Hypoglycemia can be a life-threatening condition. Symptoms of hypoglycemia (sleepiness, dizziness, and confusion) are similar to symptoms of having too much alcohol. If your health care provider says that alcohol is safe for you, follow these guidelines:  Limit alcohol intake to no more than 1 drink per day for nonpregnant women and 2 drinks per day for men. One drink equals 12 oz of beer, 5 oz of wine, or 1 oz of hard liquor.  Do not  drink on an empty stomach.  Keep yourself hydrated with water, diet soda, or unsweetened iced tea.  Keep in mind that regular soda, juice, and other mixers may contain a lot of sugar and must be counted as carbohydrates.  What are tips for following this plan? Reading food labels  Start by checking the serving size on the label. The amount of calories, carbohydrates, fats, and other nutrients listed on the label are based on one serving of the food. Many foods contain more than one serving per package.  Check the total grams (g) of carbohydrates in one serving. You can calculate the number of servings of carbohydrates in one serving by dividing the total carbohydrates by 15. For example, if a food has 30 g of total carbohydrates, it would be equal to 2 servings of carbohydrates.  Check the number of grams (g) of saturated and trans fats in one serving. Choose foods that have low or no amount of these fats.  Check the number of milligrams (mg) of sodium in one serving. Most people should limit total sodium intake to less than 2,300  mg per day.  Always check the nutrition information of foods labeled as "low-fat" or "nonfat". These foods may be higher in added sugar or refined carbohydrates and should be avoided.  Talk to your dietitian to identify your daily goals for nutrients listed on the label. Shopping  Avoid buying canned, premade, or processed foods. These foods tend to be high in fat, sodium, and added sugar.  Shop around the outside edge of the grocery store. This includes fresh fruits and vegetables, bulk grains, fresh meats, and fresh dairy. Cooking  Use low-heat cooking methods, such as baking, instead of high-heat cooking methods like deep frying.  Cook using healthy oils, such as olive, canola, or sunflower oil.  Avoid cooking with butter, cream, or high-fat meats. Meal planning  Eat meals and snacks regularly, preferably at the same times every day. Avoid going long periods of time without eating.  Eat foods high in fiber, such as fresh fruits, vegetables, beans, and whole grains. Talk to your dietitian about how many servings of carbohydrates you can eat at each meal.  Eat 4-6 ounces of lean protein each day, such as lean meat, chicken, fish, eggs, or tofu. 1 ounce is equal to 1 ounce of meat, chicken, or fish, 1 egg, or 1/4 cup of tofu.  Eat some foods each day that contain healthy fats, such as avocado, nuts, seeds, and fish. Lifestyle   Check your blood glucose regularly.  Exercise at least 30 minutes 5 or more days each week, or as told by your health care provider.  Take medicines as told by your health care provider.  Do not use any products that contain nicotine or tobacco, such as cigarettes and e-cigarettes. If you need help quitting, ask your health care provider.  Work with a Veterinary surgeon or diabetes educator to identify strategies to manage stress and any emotional and social challenges. What are some questions to ask my health care provider?  Do I need to meet with a diabetes  educator?  Do I need to meet with a dietitian?  What number can I call if I have questions?  When are the best times to check my blood glucose? Where to find more information:  American Diabetes Association: diabetes.org/food-and-fitness/food  Academy of Nutrition and Dietetics: https://www.vargas.com/  [employer] of Diabetes and Digestive and Kidney Diseases (NIH): FindJewelers.cz Summary  A healthy meal plan will help  you control your blood glucose and maintain a healthy lifestyle.  Working with a diet and nutrition specialist (dietitian) can help you make a meal plan that is best for you.  Keep in mind that carbohydrates and alcohol have immediate effects on your blood glucose levels. It is important to count carbohydrates and to use alcohol carefully. This information is not intended to replace advice given to you by your health care provider. Make sure you discuss any questions you have with your health care provider. Document Released: 11/09/2004 Document Revised: 03/19/2016 Document Reviewed: 03/19/2016 Elsevier Interactive Patient Education  [employer].

## 2017-10-24 NOTE — Progress Notes (Signed)
HPI: Jonathan Rivera is a 42 y.o. male who  has a past medical history of CP (cerebral palsy) ([place]) (10/09/2013).  he presents to [facility] today, 10/24/17,  for chief complaint of:  Diabetes  Patient is here for follow-up on discussion of the diagnosis of diabetes.  He had previous A1c a few years ago of greater than 8, never followed up.  A1c is now greater than 10.  Patient reports that his goals are to avoid all medications if he can, focus on diet/lifestyle modifications.  See assessment/plan as below   Past medical history, surgical history, and family history reviewed.  Current medication list and allergy/intolerance information reviewed.   (See remainder of HPI, ROS, Phys Exam below)  Recent Results (from the past 2160 hour(s))  CBC     Status: None   Collection Time: 10/03/17  2:21 PM  Result Value Ref Range   WBC 8.8 3.8 - 10.8 Thousand/uL   RBC 5.31 4.20 - 5.80 Million/uL   Hemoglobin 15.0 13.2 - 17.1 g/dL   HCT 44.5 38.5 - 50.0 %   MCV 83.8 80.0 - 100.0 fL   MCH 28.2 27.0 - 33.0 pg   MCHC 33.7 32.0 - 36.0 g/dL   RDW 12.7 11.0 - 15.0 %   Platelets 227 140 - 400 Thousand/uL   MPV 11.9 7.5 - 12.5 fL  COMPLETE METABOLIC PANEL WITH GFR     Status: Abnormal   Collection Time: 10/03/17  2:21 PM  Result Value Ref Range   Glucose, Bld 221 (H) 65 - 139 mg/dL    Comment: .        Non-fasting reference interval .    BUN 17 7 - 25 mg/dL   Creat 1.00 0.60 - 1.35 mg/dL   GFR, Est Non African American 94 > OR = 60 mL/min/1.16m   GFR, Est African American 109 > OR = 60 mL/min/1.764m  BUN/Creatinine Ratio NOT APPLICABLE 6 - 22 (calc)   Sodium 139 135 - 146 mmol/L   Potassium 4.4 3.5 - 5.3 mmol/L   Chloride 102 98 - 110 mmol/L   CO2 28 20 - 32 mmol/L   Calcium 9.9 8.6 - 10.3 mg/dL   Total Protein 7.3 6.1 - 8.1 g/dL   Albumin 4.8 3.6 - 5.1 g/dL   Globulin 2.5 1.9 - 3.7 g/dL (calc)   AG Ratio 1.9 1.0 - 2.5 (calc)   Total Bilirubin 0.8 0.2  - 1.2 mg/dL   Alkaline phosphatase (APISO) 84 40 - 115 U/L   AST 21 10 - 40 U/L   ALT 42 9 - 46 U/L  TSH     Status: None   Collection Time: 10/03/17  2:21 PM  Result Value Ref Range   TSH 1.79 0.40 - 4.50 mIU/L  Lipid panel     Status: Abnormal   Collection Time: 10/03/17  2:21 PM  Result Value Ref Range   Cholesterol 190 <200 mg/dL   HDL 37 (L) >40 mg/dL   Triglycerides 262 (H) <150 mg/dL    Comment: . If a non-fasting specimen was collected, consider repeat triglyceride testing on a fasting specimen if clinically indicated.  JaYates Decampt al. J. of Clin. Lipidol. [id number];3:570-177. Marland Kitchen  LDL Cholesterol (Calc) 116 (H) mg/dL (calc)    Comment: Reference range: <100 . Desirable range <100 mg/dL for primary prevention;   <70 mg/dL for patients with CHD or diabetic patients  with > or = 2 CHD risk factors. .Marland Kitchen  LDL-C is now calculated using the Martin-Hopkins  calculation, which is a validated novel method providing  better accuracy than the Friedewald equation in the  estimation of LDL-C.  Cresenciano Genre et al. Annamaria Helling. 2409;735(32): 2061-2068  (http://education.[facility].com/faq/FAQ164)    Total CHOL/HDL Ratio 5.1 (H) <5.0 (calc)   Non-HDL Cholesterol (Calc) 153 (H) <130 mg/dL (calc)    Comment: For patients with diabetes plus 1 major ASCVD risk  factor, treating to a non-HDL-C goal of <100 mg/dL  (LDL-C of <70 mg/dL) is considered a therapeutic  option.   Hemoglobin A1c     Status: Abnormal   Collection Time: 10/03/17  2:21 PM  Result Value Ref Range   Hgb A1c MFr Bld 10.2 (H) <5.7 % of total Hgb    Comment: For someone without known diabetes, a hemoglobin A1c value of 6.5% or greater indicates that they may have  diabetes and this should be confirmed with a follow-up  test. . For someone with known diabetes, a value <7% indicates  that their diabetes is well controlled and a value  greater than or equal to 7% indicates suboptimal  control. A1c targets should be  individualized based on  duration of diabetes, age, comorbid conditions, and  other considerations. . Currently, no consensus exists regarding use of hemoglobin A1c for diagnosis of diabetes for children. .    Mean Plasma Glucose 246 (calc)   eAG (mmol/L) 13.6 (calc)  HIV antibody     Status: None   Collection Time: 10/03/17  2:21 PM  Result Value Ref Range   HIV 1&2 Ab, 4th Generation NON-REACTIVE NON-REACTI    Comment: HIV-1 antigen and HIV-1/HIV-2 antibodies were not detected. There is no laboratory evidence of HIV infection. Marland Kitchen PLEASE NOTE: This information has been disclosed to you from records whose confidentiality may be protected by state law.  If your state requires such protection, then the state law prohibits you from making any further disclosure of the information without the specific written consent of the person to whom it pertains, or as otherwise permitted by law. A general authorization for the release of medical or other information is NOT sufficient for this purpose. . For additional information please refer to http://education.[facility].com/faq/FAQ106 (This link is being provided for informational/ educational purposes only.) . Marland Kitchen The performance of this assay has not been clinically validated in patients less than 66 years old. .   Hepatitis C antibody     Status: None   Collection Time: 10/03/17  2:21 PM  Result Value Ref Range   Hepatitis C Ab NON-REACTIVE NON-REACTI   SIGNAL TO CUT-OFF 0.01 <1.00    Comment: . HCV antibody was non-reactive. There is no laboratory  evidence of HCV infection. . In most cases, no further action is required. However, if recent HCV exposure is suspected, a test for HCV RNA (test code 360 [id number]) is suggested. . For additional information please refer to http://education.[facility].com/faq/FAQ22v1 (This link is being provided for informational/ educational purposes only.) .   Hepatitis B core antibody,  total     Status: None   Collection Time: 10/03/17  2:21 PM  Result Value Ref Range   Hep B Core Total Ab NON-REACTIVE NON-REACTI  Hepatitis B surface antigen     Status: None   Collection Time: 10/03/17  2:21 PM  Result Value Ref Range   Hepatitis B Surface Ag NON-REACTIVE NON-REACTI  RPR     Status: None   Collection Time: 10/03/17  2:21 PM  Result Value Ref Range  RPR Ser Ql NON-REACTIVE NON-REACTI  C. trachomatis/N. gonorrhoeae RNA     Status: None   Collection Time: 10/03/17  2:21 PM  Result Value Ref Range   C. trachomatis RNA, TMA NOT DETECTED NOT DETECT   N. gonorrhoeae RNA, TMA NOT DETECTED NOT DETECT    Comment: This test was performed using the [place] ([employer] . The analytical performance characteristics of this  assay, when used to test SurePath specimens have been determined by [employer]. .      ASSESSMENT/PLAN:   Uncontrolled type 2 diabetes mellitus with hyperglycemia (HCC) - Plan: Amb ref to Medical Nutrition Therapy-MNT   Prolonged discussion on natural history of diabetes, pathophysiology, risk of complications, management strategies, diet/lifestyle modification, use of glucometer.  Patient was advised that typically at diagnosis of A1c greater than 10 we are aggressive in starting triple therapy oral medications at the very least, though I would probably be more comfortable just going straight to insulin.  Patient is amenable to metformin only at this point, declines statin, ACE inhibitor.  Blood pressure also not at goal.  Patient was advised on glucometer use, will give trial of metformin and lifestyle modifications, but if A1c is not showing dramatic improvement will have to be more aggressive with medications to avoid complications.   Meds ordered this encounter  Medications  . metFORMIN (GLUCOPHAGE XR) 500 MG 24 hr tablet    Sig: Take 1 tablet (500 mg total) by mouth daily with breakfast for 7 days, THEN 1 tablet (500 mg  total) 2 (two) times daily for 7 days, THEN 2 tablets (1,000 mg total) 2 (two) times daily.    Dispense:  381 tablet    Refill:  0  . blood glucose meter kit and supplies KIT    Sig: Dispense based on patient and insurance preference. Use up to four times daily as directed. Please include lancets, test strips, control solution.    Dispense:  1 each    Refill:  1    Order Specific Question:   Number of strips    Answer:   100    Order Specific Question:   Number of lancets    Answer:   100      Follow-up plan: Return in about 6 weeks (around 12/05/2017) for follow-up diabetes - bring sugar readings with you to the visit .     ############################################ ############################################ ############################################ ############################################    Outpatient Encounter Medications as of 10/24/2017  Medication Sig  . cyclobenzaprine (FLEXERIL) 10 MG tablet Take 1 tablet (10 mg total) by mouth 3 (three) times daily as needed for muscle spasms. (Patient not taking: Reported on 10/03/2017)  . meloxicam (MOBIC) 15 MG tablet Take 1 tablet (15 mg total) by mouth daily. (Patient not taking: Reported on 10/03/2017)  . metFORMIN (GLUCOPHAGE) 1000 MG tablet One tablet by mouth every evening for blood sugar control.  . traMADol (ULTRAM) 50 MG tablet Take 1 tablet (50 mg total) by mouth every 8 (eight) hours as needed. (Patient not taking: Reported on 10/03/2017)  . Vitamin D, Ergocalciferol, (DRISDOL) 50000 UNITS CAPS capsule Take 1 capsule (50,000 Units total) by mouth every 7 (seven) days. Recheck Vitamin D in 3 Months (Patient not taking: Reported on 10/03/2017)   No facility-administered encounter medications on file as of 10/24/2017.    No Known Allergies     Review of Systems:  Constitutional: No recent illness  HEENT: No  headache, no vision change  Cardiac: No  chest pain,  No  pressure, No palpitations  Respiratory:  No   shortness of breath. No  Cough  Neurologic: No  weakness, No  Dizziness  Psychiatric: No  concerns with depression, No  concerns with anxiety  Exam:  BP (!) 147/97 (BP Location: Left Arm, Patient Position: Sitting, Cuff Size: Normal)   Pulse 84   Temp 98.2 F (36.8 C) (Oral)   Wt 163 lb 9.6 oz (74.2 kg)   BMI 25.62 kg/m   Constitutional: VS see above. General Appearance: alert, well-developed, well-nourished, NAD  Eyes: Normal lids and conjunctive, non-icteric sclera  Ears, Nose, Mouth, Throat: MMM, Normal external inspection ears/nares/mouth/lips/gums.  Neck: No masses, trachea midline.   Respiratory: Normal respiratory effort.   Musculoskeletal: Gait normal. Symmetric and independent movement of all extremities  Neurological: Normal balance/coordination. No tremor.  Skin: warm, dry, intact.   Psychiatric: Normal judgment/insight. Normal mood and affect. Oriented x3.   Visit summary with medication list and pertinent instructions was printed for patient to review, advised to alert [place] if any changes needed. All questions at time of visit were answered - patient instructed to contact office with any additional concerns. ER/RTC precautions were reviewed with the patient and understanding verbalized.   Follow-up plan: Return in about 6 weeks (around 12/05/2017) for follow-up diabetes - bring sugar readings with you to the visit .  Note: Total time spent 40 minutes, greater than 50% of the visit was spent face-to-face counseling and coordinating care for the following: The encounter diagnosis was Uncontrolled type 2 diabetes mellitus with hyperglycemia ([place])..  Please note: voice recognition software was used to produce this document, and typos may escape review. Please contact Dr. Sheppard Coil for any needed clarifications.

## 2017-10-26 ENCOUNTER — Encounter: Payer: Self-pay | Admitting: Osteopathic Medicine

## 2017-10-26 DIAGNOSIS — E1129 Type 2 diabetes mellitus with other diabetic kidney complication: Secondary | ICD-10-CM | POA: Insufficient documentation

## 2017-10-26 DIAGNOSIS — R809 Proteinuria, unspecified: Secondary | ICD-10-CM

## 2017-11-19 ENCOUNTER — Ambulatory Visit: Payer: Medicare HMO

## 2017-11-26 ENCOUNTER — Ambulatory Visit: Payer: Medicare HMO

## 2017-12-03 ENCOUNTER — Ambulatory Visit: Payer: Medicare HMO

## 2017-12-05 ENCOUNTER — Ambulatory Visit: Payer: Medicare HMO | Admitting: Osteopathic Medicine

## 2018-01-16 ENCOUNTER — Encounter: Payer: Self-pay | Admitting: Osteopathic Medicine

## 2018-01-16 ENCOUNTER — Ambulatory Visit (INDEPENDENT_AMBULATORY_CARE_PROVIDER_SITE_OTHER): Payer: Medicare HMO | Admitting: Osteopathic Medicine

## 2018-01-16 VITALS — BP 139/84 | HR 98 | Temp 98.4°F | Wt 152.3 lb

## 2018-01-16 DIAGNOSIS — E1165 Type 2 diabetes mellitus with hyperglycemia: Secondary | ICD-10-CM

## 2018-01-16 LAB — POCT GLYCOSYLATED HEMOGLOBIN (HGB A1C): Hemoglobin A1C: 7.2 % — AB (ref 4.0–5.6)

## 2018-01-16 NOTE — Patient Instructions (Addendum)
A1C today looks a good deal better Went from 10.2 to 7.2! Goal is 6.5 Let's see how things are looking in 3 months!

## 2018-01-16 NOTE — Progress Notes (Signed)
HPI: Jonathan Rivera is a 42 y.o. male who  has a past medical history of CP (cerebral palsy) (Jonathan Rivera) (10/09/2013).  he presents to [facility] today, 01/16/18,  for chief complaint of:  DM2 follow-up   DIABETES SCREENING/PREVENTIVE CARE: A1C: Yes  controlled? No   10/03/17: 10.2% <-- no medications. Pt was very resistant to meds. On visit 10/24/17 --> metformin to titrate up, advised RTC 6 weeks to recheck Glc but he did not follow-up. He has lost weight, about 10 lbs.   Today 01/16/18: 7.2% <--patient has not been taking the metformin, he has been really diligent about diet/exercise. BP goal <130/80: not at goal but is improved from previous LDL goal <70: No  Eye exam annually: none on file, importance discussed with patient Foot exam: No  Microalbuminuria:No   Metformin: No ACE/ARB: declined Antiplatelet if ASCVD Risk >10%: declined Statin: declined Pneumovax: declined   BP Readings from Last 3 Encounters:  01/16/18 139/84  10/24/17 (!) 147/97  10/03/17 (!) 141/99   Wt Readings from Last 3 Encounters:  01/16/18 152 lb 4.8 oz (69.1 kg)  10/24/17 163 lb 9.6 oz (74.2 kg)  10/03/17 160 lb 0.6 oz (72.6 kg)    There is no immunization history on file for this patient.     At today's visit... Past medical history, surgical history, and family history reviewed and updated as needed.  Current medication list and allergy/intolerance information reviewed and updated as needed. (See remainder of HPI, ROS, Phys Exam below)   No results found.  No results found for this or any previous visit (from the past 72 hour(s)).        ASSESSMENT/PLAN: The encounter diagnosis was Uncontrolled type 2 diabetes mellitus with hyperglycemia (Jonathan Rivera).   Orders Placed This Encounter  Procedures  . POCT HgB A1C     Patient Instructions  A1C today looks a good deal better Went from 10.2 to 7.2! Goal is 6.5 Let's see how things are looking in 3  months!    Patient would still like to avoid medications.  Given the good progress that he is made so far, I am willing to give this a try, though we did discuss goals for A1c and blood pressure and risks versus benefits of following guidelines for ACE inhibitor, statin  The 10-year ASCVD risk score Jonathan Bussing DC Brooke Bonito., et al., 2013) is: 3.9%   Values used to calculate the score:     Age: 21 years     Sex: Male     Is Non-Hispanic African American: No     Diabetic: Yes     Tobacco smoker: No     Systolic Blood Pressure: 536 mmHg     Is BP treated: No     HDL Cholesterol: 37 mg/dL     Total Cholesterol: 190 mg/dL    Follow-up plan: Return in about 3 months (around 04/18/2018) for recheck A1C - sooner if needed! .                             ############################################ ############################################ ############################################ ############################################    Current Meds  Medication Sig  . blood glucose meter kit and supplies KIT Dispense based on patient and insurance preference. Use up to four times daily as directed. Please include lancets, test strips, control solution.  . metFORMIN (GLUCOPHAGE XR) 500 MG 24 hr tablet Take 1 tablet (500 mg total) by mouth daily with breakfast for  7 days, THEN 1 tablet (500 mg total) 2 (two) times daily for 7 days, THEN 2 tablets (1,000 mg total) 2 (two) times daily.    No Known Allergies     Review of Systems:  Constitutional: No recent illness  HEENT: No  headache, no vision change  Cardiac: No  chest pain, No  pressure, No palpitations  Respiratory:  No  shortness of breath. No  Cough  Gastrointestinal: No  abdominal pain, no change on bowel habits  Musculoskeletal: No new myalgia/arthralgia  Skin: No  Rash  Hem/Onc: No  easy bruising/bleeding, No  abnormal lumps/bumps  Neurologic: No  weakness, No  Dizziness  Psychiatric: No  concerns  with depression, No  concerns with anxiety  Exam:  BP 139/84 (BP Location: Left Arm, Patient Position: Sitting, Cuff Size: Normal)   Pulse 98   Temp 98.4 F (36.9 C) (Oral)   Wt 152 lb 4.8 oz (69.1 kg)   BMI 23.85 kg/m   Constitutional: VS see above. General Appearance: alert, well-developed, well-nourished, NAD  Eyes: Normal lids and conjunctive, non-icteric sclera  Ears, Nose, Mouth, Throat: MMM, Normal external inspection ears/nares/mouth/lips/gums.  Neck: No masses, trachea midline.   Respiratory: Normal respiratory effort. no wheeze, no rhonchi, no rales  Cardiovascular: S1/S2 normal, no murmur, no rub/gallop auscultated. RRR.   Musculoskeletal: Gait normal. Symmetric and independent movement of all extremities  Abdominal: non-tender, non-distended, no appreciable organomegaly, neg Murphy's, BS WNLx4  Neurological: Normal balance/coordination. No tremor.  Skin: warm, dry, intact.   Psychiatric: Normal judgment/insight. Normal mood and affect. Oriented x3.       Visit summary with medication list and pertinent instructions was printed for patient to review, patient was advised to alert [place] if any updates are needed. All questions at time of visit were answered - patient instructed to contact office with any additional concerns. ER/RTC precautions were reviewed with the patient and understanding verbalized.   Note: Total time spent 25 minutes, greater than 50% of the visit was spent face-to-face counseling and coordinating care for the following: The encounter diagnosis was Uncontrolled type 2 diabetes mellitus with hyperglycemia ([place])..  Please note: voice recognition software was used to produce this document, and typos may escape review. Please contact Dr. Sheppard Coil for any needed clarifications.    Follow up plan: Return in about 3 months (around 04/18/2018) for recheck A1C - sooner if needed! Marland Kitchen

## 2018-01-31 ENCOUNTER — Other Ambulatory Visit: Payer: Self-pay | Admitting: Osteopathic Medicine

## 2018-04-22 ENCOUNTER — Encounter: Payer: Self-pay | Admitting: Osteopathic Medicine

## 2018-04-22 ENCOUNTER — Ambulatory Visit (INDEPENDENT_AMBULATORY_CARE_PROVIDER_SITE_OTHER): Payer: Medicare HMO | Admitting: Osteopathic Medicine

## 2018-04-22 VITALS — BP 136/82 | HR 102 | Temp 98.2°F | Wt 156.9 lb

## 2018-04-22 DIAGNOSIS — E1129 Type 2 diabetes mellitus with other diabetic kidney complication: Secondary | ICD-10-CM

## 2018-04-22 DIAGNOSIS — M545 Low back pain: Secondary | ICD-10-CM | POA: Diagnosis not present

## 2018-04-22 DIAGNOSIS — R809 Proteinuria, unspecified: Secondary | ICD-10-CM | POA: Diagnosis not present

## 2018-04-22 DIAGNOSIS — G8929 Other chronic pain: Secondary | ICD-10-CM

## 2018-04-22 LAB — POCT GLYCOSYLATED HEMOGLOBIN (HGB A1C): HEMOGLOBIN A1C: 7 % — AB (ref 4.0–5.6)

## 2018-04-22 LAB — POCT UA - MICROALBUMIN
Albumin/Creatinine Ratio, Urine, POC: ABNORMAL
Creatinine, POC: 300 mg/dL
MICROALBUMIN (UR) POC: 80 mg/L

## 2018-04-22 NOTE — Progress Notes (Signed)
HPI: Jonathan Rivera is a 43 y.o. male who  has a past medical history of CP (cerebral palsy) ([place]) (10/09/2013).  he presents to [facility] today, 04/22/18,  for chief complaint of:  DM2 follow-up  DIABETES SCREENING/PREVENTIVE CARE: A1C: Yes  controlled? No   10/03/17: 10.2% <-- no medications. Pt was very resistant to meds. On visit 10/24/17 --> metformin to titrate up, advised RTC 6 weeks to recheck Glc but he did not follow-up. He has lost weight, about 10 lbs.   01/16/18: 7.2% <--patient has not been taking the metformin, he has been really diligent about diet/exercise.  Today 04/22/18: 7.0% <-- declines medication  BP goal <130/80: not at goal but is improved from previous LDL goal <70: No  Eye exam annually: none on file, importance discussed with patient Foot exam: No  Microalbuminuria: abnormal  , declines meds Metformin: Declined ACE/ARB: declined Antiplatelet if ASCVD Risk >10%: declined Statin: declined Pneumovax: declined   BP Readings from Last 3 Encounters:  04/22/18 136/82  01/16/18 139/84  10/24/17 (!) 147/97   Wt Readings from Last 3 Encounters:  04/22/18 156 lb 14.4 oz (71.2 kg)  01/16/18 152 lb 4.8 oz (69.1 kg)  10/24/17 163 lb 9.6 oz (74.2 kg)      Back pain:  . Location: lower back, worse on L, nonradiating . Quality: sore alternative stabbing  . Severity: worse past 2 mos  . Duration: present "pretty much my whole life"  He is just noting this today as FYI for me, he doesn't want meds      At today's visit 04/22/18 ... PMH, PSH, FH reviewed and updated as needed.  Current medication list and allergy/intolerance hx reviewed and updated as needed. (See remainder of HPI, ROS, Phys Exam below)           ASSESSMENT/PLAN: The primary encounter diagnosis was Type 2 diabetes mellitus with microalbuminuria, without long-term current use of insulin ([place]). A diagnosis of Chronic left-sided low back pain  without sciatica was also pertinent to this visit.   Orders Placed This Encounter  Procedures  . POCT UA - Microalbumin  . POCT HgB A1C        Follow-up plan: Return in about 3 months (around 07/21/2018) for A1C RECHECK - sooner if needed .                                                 ################################################# ################################################# ################################################# #################################################    Current Meds  Medication Sig  . ACCU-CHEK GUIDE test strip USE UP TO 4 TIMES DAILY AS DIRECTED  . blood glucose meter kit and supplies KIT Dispense based on patient and insurance preference. Use up to four times daily as directed. Please include lancets, test strips, control solution.    No Known Allergies     Review of Systems:  Constitutional: No recent illness  Cardiac: No  chest pain, No  pressure, No palpitations  Respiratory:  No  shortness of breath. No  Cough  Musculoskeletal: +new myalgia/arthralgia  Skin: No  Rash  Neurologic: No  weakness, No  Dizziness   Exam:  BP (!) 147/85 (BP Location: Left Arm, Patient Position: Sitting, Cuff Size: Normal)   Pulse (!) 107   Temp 98.2 F (36.8 C) (Oral)   Wt 156 lb 14.4 oz (71.2 kg)   BMI  24.57 kg/m   Constitutional: VS see above. General Appearance: alert, well-developed, well-nourished, NAD  Eyes: Normal lids and conjunctive, non-icteric sclera  Ears, Nose, Mouth, Throat: MMM, Normal external inspection ears/nares/mouth/lips/gums.  Neck: No masses, trachea midline.   Respiratory: Normal respiratory effort. no wheeze, no rhonchi, no rales  Cardiovascular: S1/S2 normal, no murmur, no rub/gallop auscultated. RRR.   Musculoskeletal: Gait normal. Symmetric and independent movement of all extremities, +paraspinal tenderness lower lumbar on L, no midline spinal tenderness    Neurological: Normal balance/coordination. No tremor.  Skin: warm, dry, intact.   Psychiatric: Normal judgment/insight. Normal mood and affect. Oriented x3.       Visit summary with medication list and pertinent instructions was printed for patient to review, patient was advised to alert [place] if any updates are needed. All questions at time of visit were answered - patient instructed to contact office with any additional concerns. ER/RTC precautions were reviewed with the patient and understanding verbalized.   Note: Total time spent 15 minutes, greater than 50% of the visit was spent face-to-face counseling and coordinating care for the following: The primary encounter diagnosis was Type 2 diabetes mellitus with microalbuminuria, without long-term current use of insulin ([place]). A diagnosis of Chronic left-sided low back pain without sciatica was also pertinent to this visit.Marland Kitchen  Please note: voice recognition software was used to produce this document, and typos may escape review. Please contact Dr. Sheppard Coil for any needed clarifications.    Follow up plan: Return in about 3 months (around 07/21/2018) for A1C RECHECK - sooner if needed .

## 2018-07-22 ENCOUNTER — Ambulatory Visit (INDEPENDENT_AMBULATORY_CARE_PROVIDER_SITE_OTHER): Payer: Medicare HMO | Admitting: Osteopathic Medicine

## 2018-07-22 ENCOUNTER — Other Ambulatory Visit: Payer: Self-pay

## 2018-07-22 ENCOUNTER — Ambulatory Visit (INDEPENDENT_AMBULATORY_CARE_PROVIDER_SITE_OTHER): Payer: Medicare HMO

## 2018-07-22 ENCOUNTER — Encounter: Payer: Self-pay | Admitting: Osteopathic Medicine

## 2018-07-22 VITALS — BP 125/78 | HR 101 | Temp 98.0°F | Wt 157.7 lb

## 2018-07-22 DIAGNOSIS — G8929 Other chronic pain: Secondary | ICD-10-CM

## 2018-07-22 DIAGNOSIS — M545 Low back pain, unspecified: Secondary | ICD-10-CM

## 2018-07-22 DIAGNOSIS — Z0000 Encounter for general adult medical examination without abnormal findings: Secondary | ICD-10-CM | POA: Diagnosis not present

## 2018-07-22 DIAGNOSIS — M5136 Other intervertebral disc degeneration, lumbar region: Secondary | ICD-10-CM

## 2018-07-22 DIAGNOSIS — R809 Proteinuria, unspecified: Secondary | ICD-10-CM

## 2018-07-22 DIAGNOSIS — E1129 Type 2 diabetes mellitus with other diabetic kidney complication: Secondary | ICD-10-CM | POA: Diagnosis not present

## 2018-07-22 LAB — POCT GLYCOSYLATED HEMOGLOBIN (HGB A1C): Hemoglobin A1C: 6.7 % — AB (ref 4.0–5.6)

## 2018-07-22 NOTE — Patient Instructions (Addendum)
Please be aware that standard of care for diabetic patients includes the following medications:  Metformin to help control sugars and slow/prevent progression of diabetes from bad to worse, even if sugars are at goal (A1C 6.5-7.0%).  Blood pressure medicine Lisinopril or similar to help protect kidneys long-term, even if blood pressure is at goal (<130/80).   Statin medications to prevent against heart attack/stroke, even if cholesterol numbers are at goal (LDL/bad cholesterol <70).  If you have questions about medications, please let me know!   Standard of care for diabetics also includes having the pneumonia vaccine once before age 33, flu vaccine every fall, obtaining annual eye exam, frequently self-examining the feet and having a medical foot exam at least once per year.

## 2018-07-22 NOTE — Progress Notes (Signed)
HPI: Jonathan Rivera is a 43 y.o. male who  has a past medical history of CP (cerebral palsy) ([place]) (10/09/2013).  he presents to [facility] today, 07/22/18,  for chief complaint of:  DM2 follow-up  DIABETES SCREENING/PREVENTIVE CARE: A1C:Yescontrolled? No  10/03/17: 10.2% <-- no medications. Pt was very resistant to meds. On visit 10/24/17 -->metformin to titrate up, advised RTC 6 weeks to recheck Glc but he did not follow-up. He has lost weight, about 10 lbs.   01/16/18: 7.2% <--patient has not been taking the metformin, he has been really diligent about diet/exercise.  04/22/18: 7.0% <-- declines medication   Today, 07/22/18:  BP goal <130/80:not quite at goal but is improved from previous LDL goal <70:No Eye exam annually:none on file, importance discussed with patient Foot exam:No Microalbuminuria: abnormal, declines meds Metformin:declined  ACE/ARB:declined Antiplatelet if ASCVD Risk >10%:declined Statin:declined Pneumovax:declined  BP Readings from Last 3 Encounters:  07/22/18 125/78  04/22/18 136/82  01/16/18 139/84   Wt Readings from Last 3 Encounters:  07/22/18 157 lb 11.2 oz (71.5 kg)  04/22/18 156 lb 14.4 oz (71.2 kg)  01/16/18 152 lb 4.8 oz (69.1 kg)    Patient reports worsening low back pain, particularly with lifting or twisting or bending.     At today's visit 07/22/18 ... PMH, PSH, FH reviewed and updated as needed.  Current medication list and allergy/intolerance hx reviewed and updated as needed. (See remainder of HPI, ROS, Phys Exam below)   No results found.  No results found for this or any previous visit (from the past 72 hour(s)).        ASSESSMENT/PLAN: The encounter diagnosis was Type 2 diabetes mellitus with microalbuminuria, without long-term current use of insulin ([place]).   Orders Placed This Encounter  Procedures  . POCT HgB A1C     No orders of the defined types were  placed in this encounter.   Patient Instructions  Please be aware that standard of care for diabetic patients includes the following medications:  Metformin to help control sugars and slow/prevent progression of diabetes from bad to worse, even if sugars are at goal (A1C 6.5-7.0%).  Blood pressure medicine Lisinopril or similar to help protect kidneys long-term, even if blood pressure is at goal (<130/80).   Statin medications to prevent against heart attack/stroke, even if cholesterol numbers are at goal (LDL/bad cholesterol <70).  If you have questions about medications, please let me know!   Standard of care for diabetics also includes having the pneumonia vaccine once before age 18, flu vaccine every fall, obtaining annual eye exam, frequently self-examining the feet and having a medical foot exam at least once per year.            Follow-up plan: Return in about 4 months (around 11/22/2018) for monitor A1C, see me sooner if needed!  .                                                 ################################################# ################################################# ################################################# #################################################    Current Meds  Medication Sig  . ACCU-CHEK GUIDE test strip USE UP TO 4 TIMES DAILY AS DIRECTED  . blood glucose meter kit and supplies KIT Dispense based on patient and insurance preference. Use up to four times daily as directed. Please include lancets, test strips, control solution.    No  Known Allergies     Review of Systems:  Constitutional: No recent illness  HEENT: No  headache, no vision change  Cardiac: No  chest pain, No  pressure, No palpitations  Respiratory:  No  shortness of breath. No  Cough  Gastrointestinal: No  abdominal pain, no change on bowel habits  Musculoskeletal: No new myalgia/arthralgia  Skin: No   Rash  Hem/Onc: No  easy bruising/bleeding, No  abnormal lumps/bumps  Neurologic: No  weakness, No  Dizziness  Psychiatric: No  concerns with depression, No  concerns with anxiety  Exam:  BP 125/78 (BP Location: Left Arm, Patient Position: Sitting, Cuff Size: Normal)   Pulse (!) 101   Temp 98 F (36.7 C) (Oral)   Wt 157 lb 11.2 oz (71.5 kg)   BMI 24.70 kg/m   Constitutional: VS see above. General Appearance: alert, well-developed, well-nourished, NAD  Eyes: Normal lids and conjunctive, non-icteric sclera  Ears, Nose, Mouth, Throat: MMM, Normal external inspection ears/nares/mouth/lips/gums.  Neck: No masses, trachea midline.   Respiratory: Normal respiratory effort. no wheeze, no rhonchi, no rales  Cardiovascular: S1/S2 normal, no murmur, no rub/gallop auscultated. RRR.   Musculoskeletal: Gait normal. Symmetric and independent movement of all extremities  Abdominal: non-tender, non-distended, no appreciable organomegaly, neg Murphy's, BS WNLx4  Neurological: Normal balance/coordination. No tremor.  Skin: warm, dry, intact.   Psychiatric: Normal judgment/insight. Normal mood and affect. Oriented x3.       Visit summary with medication list and pertinent instructions was printed for patient to review, patient was advised to alert [place] if any updates are needed. All questions at time of visit were answered - patient instructed to contact office with any additional concerns. ER/RTC precautions were reviewed with the patient and understanding verbalized.    Please note: voice recognition software was used to produce this document, and typos may escape review. Please contact Dr. Sheppard Coil for any needed clarifications.    Follow up plan: Return in about 4 months (around 11/22/2018) for monitor A1C, see me sooner if needed!  Marland Kitchen

## 2018-07-23 ENCOUNTER — Encounter: Payer: Self-pay | Admitting: Osteopathic Medicine

## 2018-07-23 NOTE — Patient Instructions (Signed)
Please be aware that standard of care for diabetic patients includes the following medications:  Metformin to help control sugars and slow/prevent progression of diabetes from bad to worse, even if sugars are at goal (A1C 6.5-7.0%).  Blood pressure medicine Lisinopril or similar to help protect kidneys long-term, even if blood pressure is at goal (<130/80).   Statin medications to prevent against heart attack/stroke, even if cholesterol numbers are at goal (LDL/bad cholesterol <70).  If you have questions about medications, please let me know!   Standard of care for diabetics also includes having the pneumonia vaccine once before age 16, obtaining annual eye exam, frequently self-examining the feet and having a medical foot exam at least once per year.

## 2018-07-23 NOTE — Progress Notes (Unsigned)
HPI: Jonathan Rivera is a 43 y.o. male who  has a past medical history of CP (cerebral palsy) ([facility]) (10/09/2013).  he presents to [facility] today for chief complaint of:  Diabetes follow-up  Patient reports doing well, compliant with diet/exercise, declines medications and other preventive care measures, see assessment/plan below  Reports worsening low back pain, particularly with twisting or bending.  No recent injury.   At today's visit 07/23/18 ... PMH, PSH, FH reviewed and updated as needed.  Current medication list and allergy/intolerance hx reviewed and updated as needed. (See remainder of HPI, ROS, Phys Exam below)   Dg Lumbar Spine Complete  Result Date: 07/22/2018 CLINICAL DATA:  Chronic lower back pain.  No sciatica. EXAM: LUMBAR SPINE - COMPLETE 4+ VIEW COMPARISON:  None. FINDINGS: No fracture or spondylolisthesis is noted. Mild degenerative disc disease is noted at L4-5 and L5-S1. Remaining disc spaces are unremarkable. IMPRESSION: Mild multilevel degenerative disc disease. No acute abnormality seen in the lumbar spine. Electronically Signed   By: Lupita Raider M.D.   On: 07/22/2018 16:52    Results for orders placed or performed in visit on 07/22/18 (from the past 72 hour(s))  POCT HgB A1C     Status: Abnormal   Collection Time: 07/22/18  1:18 PM  Result Value Ref Range   Hemoglobin A1C 6.7 (A) 4.0 - 5.6 %   HbA1c POC (<> result, manual entry)     HbA1c, POC (prediabetic range)     HbA1c, POC (controlled diabetic range)            ASSESSMENT/PLAN: Diagnoses of Chronic bilateral low back pain without sciatica and Other intervertebral disc degeneration, lumbar region were pertinent to this visit.   Orders Placed This Encounter  Procedures  . MR Lumbar Spine Wo Contrast     No orders of the defined types were placed in this encounter.   Patient Instructions  Please be aware that standard of care for diabetic patients  includes the following medications:  Metformin to help control sugars and slow/prevent progression of diabetes from bad to worse, even if sugars are at goal (A1C 6.5-7.0%).  Blood pressure medicine Lisinopril or similar to help protect kidneys long-term, even if blood pressure is at goal (<130/80).   Statin medications to prevent against heart attack/stroke, even if cholesterol numbers are at goal (LDL/bad cholesterol <70).  If you have questions about medications, please let me know!   Standard of care for diabetics also includes having the pneumonia vaccine once before age 54, obtaining annual eye exam, frequently self-examining the feet and having a medical foot exam at least once per year.       Follow-up plan: Return in about 4 months (around 11/22/2018) for follow-up A1C .                                                 ################################################# ################################################# ################################################# #################################################    No outpatient medications have been marked as taking for the 07/22/18 encounter (Appointment) with [facility].    No Known Allergies     Review of Systems:  Constitutional: No recent illness  Cardiac: No  chest pain, No  pressure, No palpitations  Respiratory:  No  shortness of breath. No  Cough  Neurologic: No  weakness, No  Dizziness  Psychiatric: No  concerns with depression, No  concerns with anxiety  Exam:   Constitutional: VS see above. General Appearance: alert, well-developed, well-nourished, NAD  Eyes: Normal lids and conjunctive, non-icteric sclera  Ears, Nose, Mouth, Throat: MMM, Normal external inspection ears/nares/mouth/lips/gums.  Neck: No masses, trachea midline.   Respiratory: Normal respiratory effort. no wheeze, no rhonchi, no rales  Cardiovascular: S1/S2 normal, no murmur, no  rub/gallop auscultated. RRR.   Musculoskeletal: Gait normal. Symmetric and independent movement of all extremities  Neurological: Normal balance/coordination. No tremor.  Skin: warm, dry, intact.   Psychiatric: Normal judgment/insight. Normal mood and affect. Oriented x3.       Visit summary with medication list and pertinent instructions was printed for patient to review, patient was advised to alert us if any updates are needed. All questions at time of visit were answered - patient instructed to contact office with any additional concerns. ER/RTC precautions were reviewed with the patient and understanding verbalized.    Please note: voice recognition software was used to produce this document, and typos may escape review. Please contact Dr. Lyn HollingsheadAlexander for any needed clarifications.    Follow up plan: Return in about 4 months (around 11/22/2018) for follow-up A1C .

## 2018-09-02 ENCOUNTER — Ambulatory Visit: Payer: Medicare HMO | Admitting: Osteopathic Medicine

## 2018-09-02 ENCOUNTER — Telehealth: Payer: Self-pay | Admitting: Osteopathic Medicine

## 2018-09-02 DIAGNOSIS — G8929 Other chronic pain: Secondary | ICD-10-CM

## 2018-09-02 NOTE — Telephone Encounter (Signed)
Pt has been updated of provider's note. Agrees with recommendation. Pt given Imaging's contact info to call and schedule an appt if he is not contacted by the end of the week. Pt is also aware that his mother should keep her appt as well. No other inquiries during call.

## 2018-09-02 NOTE — Telephone Encounter (Signed)
Please call patient: We can cancel his virtual visit today, I did not realize that I had already ordered an MRI.  He should have gotten a call to schedule this but looks like it was never scheduled. The order has since expired so I have placed a new one will need to go through his insurance for authorization.  I would still encourage his mom to keep her appointment for her MRI, scheduling is not handled by [place] but by the radiology department, if there are any questions please call them at [phone].

## 2018-09-23 ENCOUNTER — Other Ambulatory Visit: Payer: Self-pay | Admitting: Osteopathic Medicine

## 2018-11-24 ENCOUNTER — Ambulatory Visit: Payer: Medicare HMO | Admitting: Osteopathic Medicine

## 2018-12-01 ENCOUNTER — Other Ambulatory Visit: Payer: Self-pay

## 2018-12-01 ENCOUNTER — Ambulatory Visit (INDEPENDENT_AMBULATORY_CARE_PROVIDER_SITE_OTHER): Payer: Medicare HMO | Admitting: Osteopathic Medicine

## 2018-12-01 ENCOUNTER — Encounter: Payer: Self-pay | Admitting: Osteopathic Medicine

## 2018-12-01 VITALS — BP 131/85 | HR 115 | Temp 98.5°F | Wt 155.2 lb

## 2018-12-01 DIAGNOSIS — Z0000 Encounter for general adult medical examination without abnormal findings: Secondary | ICD-10-CM | POA: Diagnosis not present

## 2018-12-01 DIAGNOSIS — R809 Proteinuria, unspecified: Secondary | ICD-10-CM

## 2018-12-01 DIAGNOSIS — G8929 Other chronic pain: Secondary | ICD-10-CM | POA: Diagnosis not present

## 2018-12-01 DIAGNOSIS — M545 Low back pain: Secondary | ICD-10-CM | POA: Diagnosis not present

## 2018-12-01 DIAGNOSIS — E1129 Type 2 diabetes mellitus with other diabetic kidney complication: Secondary | ICD-10-CM | POA: Diagnosis not present

## 2018-12-01 LAB — POCT GLYCOSYLATED HEMOGLOBIN (HGB A1C): Hemoglobin A1C: 6.3 % — AB (ref 4.0–5.6)

## 2018-12-01 NOTE — Progress Notes (Signed)
HPI: Jonathan Rivera is a 43 y.o. male who  has a past medical history of CP (cerebral palsy) ([place]) (10/09/2013).  he presents to [facility] today, 12/01/18,  for chief complaint of:  Diabetes follow-up   DIABETES SCREENING/PREVENTIVE CARE: A1C:Yescontrolled? No  10/03/17: 10.2% <-- no medications. Pt was very resistant to meds. On visit 10/24/17 -->metformin to titrate up, advised RTC 6 weeks to recheck Glc but he did not follow-up. He has lost weight, about 10 lbs.   01/16/18: 7.2% <--patient has not been taking the metformin, he has been really diligent about diet/exercise.  04/22/18: 7.0% <-- declines medication  07/22/18: 6.7%   Today 12/01/18: 6.3%  BP goal <130/80:not quite at goal  LDL goal <70:Noas of last check, due for labs  Eye exam annually:none on file, importance discussed with patient Foot exam:No Microalbuminuria:abnormal, declines meds Metformin:declined  ACE/ARB:declined Antiplatelet if ASCVD Risk >10%:declined Statin:declined Pneumovax:declined   BP Readings from Last 3 Encounters:  12/01/18 131/85  07/22/18 125/78  04/22/18 136/82      At today's visit 12/01/18 ... PMH, PSH, FH reviewed and updated as needed.  Current medication list and allergy/intolerance hx reviewed and updated as needed. (See remainder of HPI, ROS, Phys Exam below)   No results found.  Results for orders placed or performed in visit on 12/01/18 (from the past 72 hour(s))  POCT HgB A1C     Status: Abnormal   Collection Time: 12/01/18  3:14 PM  Result Value Ref Range   Hemoglobin A1C 6.3 (A) 4.0 - 5.6 %   HbA1c POC (<> result, manual entry)     HbA1c, POC (prediabetic range)     HbA1c, POC (controlled diabetic range)            ASSESSMENT/PLAN: The primary encounter diagnosis was Type 2 diabetes mellitus with microalbuminuria, without long-term current use of insulin ([place]). A diagnosis of Chronic midline low  back pain without sciatica was also pertinent to this visit.   Low back pain not improved w/ 6+ weeks conservative tx, hasn't heard back about MRI yet, will look into this  A1C looks good!   Orders Placed This Encounter  Procedures  . POCT HgB A1C        Follow-up plan: Return in about 4 months (around 04/03/2019) for recheck A1C / sooner if needed! .                                                 ################################################# ################################################# ################################################# #################################################    Current Meds  Medication Sig  . ACCU-CHEK GUIDE test strip USE UP TO 4 TIMES DAILY AS DIRECTED  . blood glucose meter kit and supplies KIT Dispense based on patient and insurance preference. Use up to four times daily as directed. Please include lancets, test strips, control solution.    No Known Allergies     Review of Systems:  Constitutional: No recent illness  Cardiac: No  chest pain, No  pressure, No palpitations  Respiratory:  No  shortness of breath. No  Cough  Musculoskeletal: No new myalgia/arthralgia  Neurologic: No  weakness, No  Dizziness   Exam:  BP 131/85 (BP Location: Left Arm, Patient Position: Sitting, Cuff Size: Normal)   Pulse (!) 115   Temp 98.5 F (36.9 C) (Oral)   Wt 155 lb 3.2 oz (70.4  kg)   BMI 24.31 kg/m   Constitutional: VS see above. General Appearance: alert, well-developed, well-nourished, NAD  Respiratory: Normal respiratory effort.   Musculoskeletal: Gait normal. Symmetric and independent movement of all extremities  Neurological: Normal balance/coordination. No tremor.  Skin: warm, dry, intact.   Psychiatric: Normal judgment/insight. Normal mood and affect. Oriented x3.       Visit summary with medication list and pertinent instructions was printed for patient to review,  patient was advised to alert [place] if any updates are needed. All questions at time of visit were answered - patient instructed to contact office with any additional concerns. ER/RTC precautions were reviewed with the patient and understanding verbalized.   Note: Total time spent 15 minutes, greater than 50% of the visit was spent face-to-face counseling and coordinating care for the following: The primary encounter diagnosis was Type 2 diabetes mellitus with microalbuminuria, without long-term current use of insulin ([place]). A diagnosis of Chronic midline low back pain without sciatica was also pertinent to this visit.Marland Kitchen  Please note: voice recognition software was used to produce this document, and typos may escape review. Please contact Dr. Sheppard Coil for any needed clarifications.    Follow up plan: Return in about 4 months (around 04/03/2019) for recheck A1C / sooner if needed! Marland Kitchen

## 2018-12-02 LAB — COMPLETE METABOLIC PANEL WITH GFR
AG Ratio: 1.9 (calc) (ref 1.0–2.5)
ALT: 31 U/L (ref 9–46)
AST: 19 U/L (ref 10–40)
Albumin: 4.9 g/dL (ref 3.6–5.1)
Alkaline phosphatase (APISO): 81 U/L (ref 36–130)
BUN: 11 mg/dL (ref 7–25)
CO2: 17 mmol/L — ABNORMAL LOW (ref 20–32)
Calcium: 9.5 mg/dL (ref 8.6–10.3)
Chloride: 101 mmol/L (ref 98–110)
Creat: 1.1 mg/dL (ref 0.60–1.35)
GFR, Est African American: 95 mL/min/{1.73_m2} (ref >=60)
GFR, Est Non African American: 82 mL/min/{1.73_m2} (ref >=60)
Globulin: 2.6 g/dL (calc) (ref 1.9–3.7)
Glucose, Bld: 76 mg/dL (ref 65–99)
Potassium: 4.1 mmol/L (ref 3.5–5.3)
Sodium: 138 mmol/L (ref 135–146)
Total Bilirubin: 1.1 mg/dL (ref 0.2–1.2)
Total Protein: 7.5 g/dL (ref 6.1–8.1)

## 2018-12-02 LAB — CBC
HCT: 48.5 % (ref 38.5–50.0)
Hemoglobin: 16.2 g/dL (ref 13.2–17.1)
MCH: 28.5 pg (ref 27.0–33.0)
MCHC: 33.4 g/dL (ref 32.0–36.0)
MCV: 85.2 fL (ref 80.0–100.0)
MPV: 11.6 fL (ref 7.5–12.5)
Platelets: 254 10*3/uL (ref 140–400)
RBC: 5.69 10*6/uL (ref 4.20–5.80)
RDW: 12.6 % (ref 11.0–15.0)
WBC: 10.4 10*3/uL (ref 3.8–10.8)

## 2018-12-02 LAB — LIPID PANEL
Cholesterol: 163 mg/dL (ref <200)
HDL: 36 mg/dL — ABNORMAL LOW (ref >=40)
LDL Cholesterol (Calc): 104 mg/dL (calc) — ABNORMAL HIGH
Non-HDL Cholesterol (Calc): 127 mg/dL (calc) (ref <130)
Total CHOL/HDL Ratio: 4.5 (calc) (ref <5.0)
Triglycerides: 136 mg/dL (ref <150)

## 2018-12-02 LAB — HEMOGLOBIN A1C
Hgb A1c MFr Bld: 6.5 % of total Hgb — ABNORMAL HIGH (ref <5.7)
Mean Plasma Glucose: 140 (calc)
eAG (mmol/L): 7.7 (calc)

## 2019-04-02 ENCOUNTER — Other Ambulatory Visit: Payer: Self-pay

## 2019-04-02 ENCOUNTER — Ambulatory Visit (INDEPENDENT_AMBULATORY_CARE_PROVIDER_SITE_OTHER): Payer: Medicare HMO | Admitting: Osteopathic Medicine

## 2019-04-02 ENCOUNTER — Encounter: Payer: Self-pay | Admitting: Osteopathic Medicine

## 2019-04-02 VITALS — BP 129/85 | HR 102 | Temp 98.1°F | Wt 157.1 lb

## 2019-04-02 DIAGNOSIS — G8929 Other chronic pain: Secondary | ICD-10-CM | POA: Diagnosis not present

## 2019-04-02 DIAGNOSIS — R5382 Chronic fatigue, unspecified: Secondary | ICD-10-CM | POA: Diagnosis not present

## 2019-04-02 DIAGNOSIS — E1129 Type 2 diabetes mellitus with other diabetic kidney complication: Secondary | ICD-10-CM | POA: Diagnosis not present

## 2019-04-02 DIAGNOSIS — M545 Low back pain, unspecified: Secondary | ICD-10-CM

## 2019-04-02 DIAGNOSIS — R809 Proteinuria, unspecified: Secondary | ICD-10-CM

## 2019-04-02 LAB — POCT GLYCOSYLATED HEMOGLOBIN (HGB A1C): Hemoglobin A1C: 7.2 % — AB (ref 4.0–5.6)

## 2019-04-02 MED ORDER — VITAMIN D (ERGOCALCIFEROL) 1.25 MG (50000 UNIT) PO CAPS: 50000.0000 [IU] | ORAL_CAPSULE | ORAL | 0 refills | Status: DC

## 2019-04-02 NOTE — Progress Notes (Signed)
Jonathan Rivera is a 44 y.o. male who presents to  [facility]  today, 04/02/19, seeking care for the following:  The primary encounter diagnosis was Type 2 diabetes mellitus with microalbuminuria, without long-term current use of insulin ([place]). Diagnoses of Chronic fatigue and Chronic midline low back pain without sciatica were also pertinent to this visit.   ASSESSMENT & PLAN with other pertinent history/findings:   DIABETES SCREENING/PREVENTIVE CARE: A1C:  10/03/17: 10.2% <-- no medications. Pt was very resistant to meds. -->Rx written for metformin to titrate up, advised RTC 6 weeks to recheck Glc but he did not follow-up.   01/16/18: 7.2% <--patient has not been taking the metformin, he has been really diligent about diet/exercise.  04/22/18: 7.0% <-- declines medication  07/22/18:6.7%   12/01/18: 6.3% <-- still doing well no meds   Today 04/02/19: 7.2 --> pt would like to hold off on meds and recheck in 3 mos   BP goal <130/80:notquiteat goal  BP Readings from Last 3 Encounters:  04/02/19 129/85  12/01/18 131/85  07/22/18 125/78   LDL goal <70:Noas of last check, due for labs  Eye exam annually:none on file, importance discussed with patient Foot exam:done 04/02/19 today  Microalbuminuria:abnormal, declines meds Metformin:declined  ACE/ARB:declined Antiplatelet if ASCVD Risk >10%:declined Statin:declined Pneumovax:declined  Wt Readings from Last 3 Encounters:  04/02/19 157 lb 1.9 oz (71.3 kg)  12/01/18 155 lb 3.2 oz (70.4 kg)  07/22/18 157 lb 11.2 oz (71.5 kg)       Orders Placed This Encounter  Procedures  . MR Lumbar Spine Wo Contrast  . Testosterone Total,Free,Bio, Males  . POCT HgB A1C   Results for orders placed or performed in visit on 04/02/19 (from the past 24 hour(s))  POCT HgB A1C     Status: Abnormal   Collection Time: 04/02/19  3:17 PM  Result Value Ref Range   Hemoglobin A1C 7.2 (A) 4.0 - 5.6 %   HbA1c POC (<> result, manual entry)     HbA1c, POC (prediabetic range)     HbA1c, POC (controlled diabetic range)      No orders of the defined types were placed in this encounter.   There are no Patient Instructions on file for this visit.    Follow-up instructions: Return in about 3 months (around 06/30/2019) for recheck A1C .     BP 129/85 (BP Location: Left Arm, Patient Position: Sitting, Cuff Size: Normal)   Pulse (!) 102   Temp 98.1 F (36.7 C) (Oral)   Wt 157 lb 1.9 oz (71.3 kg)   BMI 24.61 kg/m   Current Meds  Medication Sig  . ACCU-CHEK GUIDE test strip USE UP TO 4 TIMES DAILY AS DIRECTED  . blood glucose meter kit and supplies KIT Dispense based on patient and insurance preference. Use up to four times daily as directed. Please include lancets, test strips, control solution.    Results for orders placed or performed in visit on 04/02/19 (from the past 72 hour(s))  POCT HgB A1C     Status: Abnormal   Collection Time: 04/02/19  3:17 PM  Result Value Ref Range   Hemoglobin A1C 7.2 (A) 4.0 - 5.6 %   HbA1c POC (<> result, manual entry)     HbA1c, POC (prediabetic range)     HbA1c, POC (controlled diabetic range)      No results found.  Depression screen [facility] 2/9 01/16/2018 10/03/2017  Decreased Interest 0 0  Down, Depressed, Hopeless 0 0  PHQ - 2 Score 0 0  Altered sleeping - 0  Tired, decreased energy - 0  Change in appetite - 0  Feeling bad or failure about yourself  - 0  Trouble concentrating - 0  Moving slowly or fidgety/restless - 0  Suicidal thoughts - 0  PHQ-9 Score - 0    GAD 7 : Generalized Anxiety Score 01/16/2018 10/03/2017  Nervous, Anxious, on Edge 0 0  Control/stop worrying 0 0  Worry too much - different things 0 0  Trouble relaxing 0 0  Restless 0 0  Easily annoyed or irritable 0 0  Afraid - awful might happen 0 0  Total GAD 7 Score 0 0      All questions at time of visit were answered - patient  instructed to contact office with any additional concerns or updates.  ER/RTC precautions were reviewed with the patient.  Please note: voice recognition software was used to produce this document, and typos may escape review. Please contact Dr. Sheppard Coil for any needed clarifications.

## 2019-04-02 NOTE — Addendum Note (Signed)
Addended by: Deirdre Pippins on: 04/02/2019 04:50 PM   Modules accepted: Orders

## 2019-04-04 ENCOUNTER — Ambulatory Visit (INDEPENDENT_AMBULATORY_CARE_PROVIDER_SITE_OTHER): Payer: Medicare HMO

## 2019-04-04 ENCOUNTER — Other Ambulatory Visit: Payer: Self-pay

## 2019-04-04 DIAGNOSIS — G8929 Other chronic pain: Secondary | ICD-10-CM | POA: Diagnosis not present

## 2019-04-04 DIAGNOSIS — M545 Low back pain, unspecified: Secondary | ICD-10-CM

## 2019-06-19 ENCOUNTER — Other Ambulatory Visit: Payer: Self-pay | Admitting: Osteopathic Medicine

## 2019-06-23 ENCOUNTER — Other Ambulatory Visit: Payer: Self-pay | Admitting: Osteopathic Medicine

## 2019-07-07 ENCOUNTER — Other Ambulatory Visit: Payer: Self-pay

## 2019-07-07 ENCOUNTER — Ambulatory Visit (INDEPENDENT_AMBULATORY_CARE_PROVIDER_SITE_OTHER): Payer: Medicare HMO | Admitting: Osteopathic Medicine

## 2019-07-07 ENCOUNTER — Encounter: Payer: Self-pay | Admitting: Osteopathic Medicine

## 2019-07-07 VITALS — BP 127/87 | HR 85 | Temp 97.9°F | Wt 162.0 lb

## 2019-07-07 DIAGNOSIS — R5382 Chronic fatigue, unspecified: Secondary | ICD-10-CM | POA: Diagnosis not present

## 2019-07-07 DIAGNOSIS — E1165 Type 2 diabetes mellitus with hyperglycemia: Secondary | ICD-10-CM

## 2019-07-07 LAB — POCT GLYCOSYLATED HEMOGLOBIN (HGB A1C): Hemoglobin A1C: 7.3 % — AB (ref 4.0–5.6)

## 2019-07-07 NOTE — Progress Notes (Signed)
Jonathan Rivera is a 44 y.o. male who presents to  [place] at [facility]  today, 07/07/19, seeking care for the following:  The encounter diagnosis was Uncontrolled type 2 diabetes mellitus with hyperglycemia ([place]).   ASSESSMENT & PLAN with other pertinent history/findings:   DIABETES SCREENING/PREVENTIVE CARE: A1C:  10/03/17: 10.2% <-- no medications. Pt was very resistant to meds. -->Rx written for metformin to titrate up, advised RTC 6 weeks to recheck Glc but he did not follow-up.   01/16/18: 7.2% <--patient has not been taking the metformin, he has been really diligent about diet/exercise.  04/22/18: 7.0% <-- declines medication  07/22/18:6.7%   12/01/18: 6.3% <-- still doing well no meds, 155 lb   04/02/19: 7.2 --> pt would like to hold off on meds and recheck in 3 mos. Wt 157 lb  Today 07/07/19 7.3 --> no change in Rx, Wt 162 lb   BP goal <130/80:notquiteat goal  BP Readings from Last 3 Encounters:  07/07/19 127/87  04/02/19 129/85  12/01/18 131/85   LDL goal <70:Noas of last check, due for labs  Eye exam annually:none on file, importance discussed with patient Foot exam:done 04/02/19 today  Microalbuminuria:abnormal, declines meds Metformin:declined  ACE/ARB:declined Antiplatelet if ASCVD Risk >10%:declined Statin:declined Pneumovax:declined  Wt Readings from Last 3 Encounters:  07/07/19 162 lb 0.6 oz (73.5 kg)  04/02/19 157 lb 1.9 oz (71.3 kg)  12/01/18 155 lb 3.2 oz (70.4 kg)       Orders Placed This Encounter  Procedures  . POCT HgB A1C       Follow-up instructions: Return for recheck A1C in 3-4 months .     BP 127/87 (BP Location: Left Arm, Patient Position: Sitting, Cuff Size: Normal)   Pulse 85   Temp 97.9 F (36.6 C) (Oral)   Wt 162 lb 0.6 oz (73.5 kg)   BMI 25.38 kg/m   Current Meds  Medication Sig  . ACCU-CHEK GUIDE test strip USE UP TO 4 TIMES DAILY AS  DIRECTED  . blood glucose meter kit and supplies KIT Dispense based on patient and insurance preference. Use up to four times daily as directed. Please include lancets, test strips, control solution.  . Vitamin D, Ergocalciferol, (DRISDOL) 1.25 MG (50000 UNIT) CAPS capsule Take 1 capsule (50,000 Units total) by mouth every 7 (seven) days. Take for 12 total doses(weeks) than can transition to 1000 units OTC supplement daily    Results for orders placed or performed in visit on 07/07/19 (from the past 72 hour(s))  POCT HgB A1C     Status: Abnormal   Collection Time: 07/07/19  3:35 PM  Result Value Ref Range   Hemoglobin A1C 7.3 (A) 4.0 - 5.6 %   HbA1c POC (<> result, manual entry)     HbA1c, POC (prediabetic range)     HbA1c, POC (controlled diabetic range)      No results found.  Depression screen [facility] 2/9 01/16/2018 10/03/2017  Decreased Interest 0 0  Down, Depressed, Hopeless 0 0  PHQ - 2 Score 0 0  Altered sleeping - 0  Tired, decreased energy - 0  Change in appetite - 0  Feeling bad or failure about yourself  - 0  Trouble concentrating - 0  Moving slowly or fidgety/restless - 0  Suicidal thoughts - 0  PHQ-9 Score - 0    GAD 7 : Generalized Anxiety Score 01/16/2018 10/03/2017  Nervous, Anxious, on Edge 0 0  Control/stop worrying 0 0  Worry too much - different things 0 0  Trouble relaxing 0 0  Restless 0 0  Easily annoyed or irritable 0 0  Afraid - awful might happen 0 0  Total GAD 7 Score 0 0      All questions at time of visit were answered - patient instructed to contact office with any additional concerns or updates.  ER/RTC precautions were reviewed with the patient.  Please note: voice recognition software was used to produce this document, and typos may escape review. Please contact Dr. Sheppard Coil for any needed clarifications.

## 2019-07-08 LAB — TESTOSTERONE TOTAL,FREE,BIO, MALES
Albumin: 4.7 g/dL (ref 3.6–5.1)
Sex Hormone Binding: 34 nmol/L (ref 10–50)
Testosterone, Bioavailable: 128.5 ng/dL (ref >=110.0)
Testosterone, Free: 59.9 pg/mL (ref 46.0–224.0)
Testosterone: 466 ng/dL (ref 250–827)

## 2019-10-07 ENCOUNTER — Ambulatory Visit (INDEPENDENT_AMBULATORY_CARE_PROVIDER_SITE_OTHER): Payer: Medicare HMO | Admitting: Osteopathic Medicine

## 2019-10-07 ENCOUNTER — Other Ambulatory Visit: Payer: Self-pay

## 2019-10-07 ENCOUNTER — Encounter: Payer: Self-pay | Admitting: Osteopathic Medicine

## 2019-10-07 VITALS — BP 125/80 | HR 96 | Wt 161.0 lb

## 2019-10-07 DIAGNOSIS — R809 Proteinuria, unspecified: Secondary | ICD-10-CM

## 2019-10-07 DIAGNOSIS — E1129 Type 2 diabetes mellitus with other diabetic kidney complication: Secondary | ICD-10-CM

## 2019-10-07 LAB — POCT GLYCOSYLATED HEMOGLOBIN (HGB A1C)
HbA1c POC (<> result, manual entry): 8.2 % (ref 4.0–5.6)
HbA1c, POC (controlled diabetic range): 8.2 % — AB (ref 0.0–7.0)
HbA1c, POC (prediabetic range): 8.2 % — AB (ref 5.7–6.4)
Hemoglobin A1C: 8.2 % — AB (ref 4.0–5.6)

## 2019-10-07 NOTE — Progress Notes (Signed)
Jonathan Rivera is a 44 y.o. male who presents to  [place] at [facility]  today, 07/07/19, seeking care for the following:  The encounter diagnosis was Type 2 diabetes mellitus with microalbuminuria, without long-term current use of insulin ([place]).   ASSESSMENT & PLAN with other pertinent history/findings:   DIABETES SCREENING/PREVENTIVE CARE: A1C:  10/03/17: 10.2% <-- no medications. Pt was very resistant to meds. -->Rx written for metformin to titrate up, advised RTC 6 weeks to recheck Glc but he did not follow-up.   01/16/18: 7.2% <--patient has not been taking the metformin, he has been really diligent about diet/exercise.  04/22/18: 7.0% <-- declines medication  07/22/18:6.7%   12/01/18: 6.3% <-- still doing well no meds, 155 lb   04/02/19: 7.2 --> pt would like to hold off on meds and recheck in 3 mos. Wt 157 lb  07/07/19 7.3 --> no change in Rx, Wt 162 lb   Today 10/07/19 8.2 --> PT REPORTS LACK OF CONSISTENT DIET, NO CHNGE rX  BP goal <130/80:notquiteat goal  BP Readings from Last 3 Encounters:  10/07/19 125/80  07/07/19 127/87  04/02/19 129/85   LDL goal <70:Noas of last check, due for labs  Eye exam annually:none on file, importance discussed with patient Foot exam:done 04/02/19 today  Microalbuminuria:abnormal, declines meds Metformin:declined  ACE/ARB:declined Antiplatelet if ASCVD Risk >10%:declined Statin:declined Pneumovax:declined  Wt Readings from Last 3 Encounters:  10/07/19 161 lb (73 kg)  07/07/19 162 lb 0.6 oz (73.5 kg)  04/02/19 157 lb 1.9 oz (71.3 kg)       Orders Placed This Encounter  Procedures   POCT HgB A1C       Follow-up instructions: Return in about 3 months (around 01/07/2020) for monitor a1c - see [place] sooner if needed .     BP 125/80    Pulse 96    Wt 161 lb (73 kg)    SpO2 98%    BMI 25.22 kg/m   Current Meds  Medication Sig   ACCU-CHEK GUIDE test  strip USE UP TO 4 TIMES DAILY AS DIRECTED   blood glucose meter kit and supplies KIT Dispense based on patient and insurance preference. Use up to four times daily as directed. Please include lancets, test strips, control solution.   Vitamin D, Ergocalciferol, (DRISDOL) 1.25 MG (50000 UNIT) CAPS capsule Take 1 capsule (50,000 Units total) by mouth every 7 (seven) days. Take for 12 total doses(weeks) than can transition to 1000 units OTC supplement daily    Results for orders placed or performed in visit on 10/07/19 (from the past 72 hour(s))  POCT HgB A1C     Status: Abnormal   Collection Time: 10/07/19  3:40 PM  Result Value Ref Range   Hemoglobin A1C 8.2 (A) 4.0 - 5.6 %   HbA1c POC (<> result, manual entry) 8.2 4.0 - 5.6 %   HbA1c, POC (prediabetic range) 8.2 (A) 5.7 - 6.4 %   HbA1c, POC (controlled diabetic range) 8.2 (A) 0.0 - 7.0 %    No results found.  Depression screen [facility] 2/9 01/16/2018 10/03/2017  Decreased Interest 0 0  Down, Depressed, Hopeless 0 0  PHQ - 2 Score 0 0  Altered sleeping - 0  Tired, decreased energy - 0  Change in appetite - 0  Feeling bad or failure about yourself  - 0  Trouble concentrating - 0  Moving slowly or fidgety/restless - 0  Suicidal thoughts - 0  PHQ-9 Score - 0  GAD 7 : Generalized Anxiety Score 01/16/2018 10/03/2017  Nervous, Anxious, on Edge 0 0  Control/stop worrying 0 0  Worry too much - different things 0 0  Trouble relaxing 0 0  Restless 0 0  Easily annoyed or irritable 0 0  Afraid - awful might happen 0 0  Total GAD 7 Score 0 0      All questions at time of visit were answered - patient instructed to contact office with any additional concerns or updates.  ER/RTC precautions were reviewed with the patient.  Please note: voice recognition software was used to produce this document, and typos may escape review. Please contact Dr. Sheppard Coil for any needed clarifications.

## 2020-01-06 ENCOUNTER — Ambulatory Visit: Payer: Medicare HMO | Admitting: Osteopathic Medicine

## 2020-01-11 ENCOUNTER — Encounter: Payer: Self-pay | Admitting: Osteopathic Medicine

## 2020-01-11 ENCOUNTER — Ambulatory Visit (INDEPENDENT_AMBULATORY_CARE_PROVIDER_SITE_OTHER): Payer: Medicare HMO | Admitting: Osteopathic Medicine

## 2020-01-11 VITALS — BP 138/93 | HR 106 | Temp 97.9°F | Wt 154.0 lb

## 2020-01-11 DIAGNOSIS — R809 Proteinuria, unspecified: Secondary | ICD-10-CM | POA: Diagnosis not present

## 2020-01-11 DIAGNOSIS — E1129 Type 2 diabetes mellitus with other diabetic kidney complication: Secondary | ICD-10-CM

## 2020-01-11 LAB — POCT GLYCOSYLATED HEMOGLOBIN (HGB A1C): Hemoglobin A1C: 8.5 % — AB (ref 4.0–5.6)

## 2020-01-11 MED ORDER — OZEMPIC (0.25 OR 0.5 MG/DOSE) 2 MG/1.5ML ~~LOC~~ SOPN: PEN_INJECTOR | SUBCUTANEOUS | 0 refills | Status: DC

## 2020-01-11 MED ORDER — VITAMIN D (ERGOCALCIFEROL) 1.25 MG (50000 UNIT) PO CAPS: 50000.0000 [IU] | ORAL_CAPSULE | ORAL | 3 refills | Status: DC

## 2020-01-11 NOTE — Progress Notes (Signed)
Jonathan Rivera is a 43 y.o. male who presents to  [place] at [facility]  today, 01/11/20, seeking care for the following:  Marland Kitchen Monitor A1C   DIABETES SCREENING/PREVENTIVE CARE: A1C:  10/03/17: 10.2% <-- no medications. Pt was very resistant to meds. -->Rx written for metformin to titrate up, advised RTC 6 weeks to recheck Glc but he did not follow-up.   01/16/18: 7.2% <--patient has not been taking the metformin, he has been really diligent about diet/exercise.  04/22/18: 7.0% <-- declines medication  07/22/18:6.7%   12/01/18: 6.3%<-- still doing well no meds, 155 lb   04/02/19: 7.2 --> pt would like to hold off on meds and recheck in 3 mos. Wt 157 lb  07/07/19 7.3 --> no change in Rx, Wt 162 lb   10/07/19 8.2 --> reported lacking consistent diet, opted to work on this, no change in Rx   Today 01/11/20 8.5 --> pt amenable to weekly injections, would like to avoid dialy medication since he's not on anything else on a daily basis. Rx Ozempic    BP goal <130/80:notquiteat goal     BP Readings:  01/11/20 133/95  10/07/19 125/80  07/07/19 127/87  04/02/19 129/85   LDL goal <70:Noas of last check, due for labs, ordered for prior to next visit  Eye exam annually:none on file, importance discussed with patient Foot exam:done 04/02/19  Microalbuminuria:abnormal, declines meds Metformin:declined  ACE/ARB:declined Antiplatelet if ASCVD Risk >10%:declined Statin:declined Pneumovax:declined     Wt Readings  01/11/20 154 lb  10/07/19 161 lb (73 kg)  07/07/19 162 lb 0.6 oz (73.5 kg)  04/02/19 157 lb 1.9 oz (71.3 kg)       ASSESSMENT & PLAN with other pertinent findings:  The encounter diagnosis was Type 2 diabetes mellitus with microalbuminuria, without long-term current use of insulin ([place]).   --> new Rx Ozempic, discussed potential adverse effects    Results for orders placed or performed in visit  on 01/11/20 (from the past 24 hour(s))  POCT HgB A1C     Status: Abnormal   Collection Time: 01/11/20  3:56 PM  Result Value Ref Range   Hemoglobin A1C 8.5 (A) 4.0 - 5.6 %   HbA1c POC (<> result, manual entry)     HbA1c, POC (prediabetic range)     HbA1c, POC (controlled diabetic range)       There are no Patient Instructions on file for this visit.  Orders Placed This Encounter  Procedures  . POCT HgB A1C    Meds ordered this encounter  Medications  . Semaglutide,0.25 or 0.5MG/DOS, (OZEMPIC, 0.25 OR 0.5 MG/DOSE,) 2 MG/1.5ML SOPN    Sig: Inject 0.25 mg into the skin once a week for 28 days, THEN 0.5 mg once a week.    Dispense:  7.5 mL    Refill:  0  . Vitamin D, Ergocalciferol, (DRISDOL) 1.25 MG (50000 UNIT) CAPS capsule    Sig: Take 1 capsule (50,000 Units total) by mouth every 7 (seven) days.    Dispense:  12 capsule    Refill:  3       Follow-up instructions: Return in about 3 months (around 04/12/2020) for RECHECK A1C, FASTING LABS (EARLY AM APPT, COME FASTING, NO FFOD 8 HOURS PRIOR TO APPT) .  BP (!) 138/93 (BP Location: Left Arm, Patient Position: Sitting, Cuff Size: Normal)   Pulse (!) 106   Temp 97.9 F (36.6 C) (Oral)   Wt 154 lb 0.6 oz (69.9 kg)   BMI 24.13 kg/m   Current Meds  Medication Sig  . ACCU-CHEK GUIDE test strip USE UP TO 4 TIMES DAILY AS DIRECTED  . blood glucose meter kit and supplies KIT Dispense based on patient and insurance preference. Use up to four times daily as directed. Please include lancets, test strips, control solution.  . Vitamin D, Ergocalciferol, (DRISDOL) 1.25 MG (50000 UNIT) CAPS capsule Take 1 capsule (50,000 Units total) by mouth every 7 (seven) days.  . [DISCONTINUED] Vitamin D, Ergocalciferol, (DRISDOL) 1.25 MG (50000 UNIT) CAPS capsule Take 1 capsule (50,000 Units total) by mouth every 7 (seven) days. Take for 12 total doses(weeks) than can  transition to 1000 units OTC supplement daily    Results for orders placed or performed in visit on 01/11/20 (from the past 72 hour(s))  POCT HgB A1C     Status: Abnormal   Collection Time: 01/11/20  3:56 PM  Result Value Ref Range   Hemoglobin A1C 8.5 (A) 4.0 - 5.6 %   HbA1c POC (<> result, manual entry)     HbA1c, POC (prediabetic range)     HbA1c, POC (controlled diabetic range)      No results found.     All questions at time of visit were answered - patient instructed to contact office with any additional concerns or updates.  ER/RTC precautions were reviewed with the patient as applicable.   Please note: voice recognition software was used to produce this document, and typos may escape review. Please contact Dr. Sheppard Coil for any needed clarifications.

## 2020-03-18 ENCOUNTER — Ambulatory Visit (INDEPENDENT_AMBULATORY_CARE_PROVIDER_SITE_OTHER): Payer: Medicare HMO | Admitting: Osteopathic Medicine

## 2020-03-18 DIAGNOSIS — Z0000 Encounter for general adult medical examination without abnormal findings: Secondary | ICD-10-CM | POA: Diagnosis not present

## 2020-03-18 NOTE — Patient Instructions (Addendum)
Diabetes Mellitus and Foot Care Foot care is an important part of your health, especially when you have diabetes. Diabetes may cause you to have problems because of poor blood flow (circulation) to your feet and legs, which can cause your skin to:  Become thinner and drier.  Break more easily.  Heal more slowly.  Peel and crack. You may also have nerve damage (neuropathy) in your legs and feet, causing decreased feeling in them. This means that you may not notice minor injuries to your feet that could lead to more serious problems. Noticing and addressing any potential problems early is the best way to prevent future foot problems. How to care for your feet Foot hygiene  Wash your feet daily with warm water and mild soap. Do not use hot water. Then, pat your feet and the areas between your toes until they are completely dry. Do not soak your feet as this can dry your skin.  Trim your toenails straight across. Do not dig under them or around the cuticle. File the edges of your nails with an emery board or nail file.  Apply a moisturizing lotion or petroleum jelly to the skin on your feet and to dry, brittle toenails. Use lotion that does not contain alcohol and is unscented. Do not apply lotion between your toes.   Shoes and socks  Wear clean socks or stockings every day. Make sure they are not too tight. Do not wear knee-high stockings since they may decrease blood flow to your legs.  Wear shoes that fit properly and have enough cushioning. Always look in your shoes before you put them on to be sure there are no objects inside.  To break in new shoes, wear them for just a few hours a day. This prevents injuries on your feet. Wounds, scrapes, corns, and calluses  Check your feet daily for blisters, cuts, bruises, sores, and redness. If you cannot see the bottom of your feet, use a mirror or ask someone for help.  Do not cut corns or calluses or try to remove them with medicine.  If  you find a minor scrape, cut, or break in the skin on your feet, keep it and the skin around it clean and dry. You may clean these areas with mild soap and water. Do not clean the area with peroxide, alcohol, or iodine.  If you have a wound, scrape, corn, or callus on your foot, look at it several times a day to make sure it is healing and not infected. Check for: ? Redness, swelling, or pain. ? Fluid or blood. ? Warmth. ? Pus or a bad smell.   General tips  Do not cross your legs. This may decrease blood flow to your feet.  Do not use heating pads or hot water bottles on your feet. They may burn your skin. If you have lost feeling in your feet or legs, you may not know this is happening until it is too late.  Protect your feet from hot and cold by wearing shoes, such as at the beach or on hot pavement.  Schedule a complete foot exam at least once a year (annually) or more often if you have foot problems. Report any cuts, sores, or bruises to your health care provider immediately. Where to find more information  American Diabetes Association: www.diabetes.org  Association of Diabetes Care & Education Specialists: www.diabeteseducator.org Contact a health care provider if:  You have a medical condition that increases your risk of infection  and you have any cuts, sores, or bruises on your feet.  You have an injury that is not healing.  You have redness on your legs or feet.  You feel burning or tingling in your legs or feet.  You have pain or cramps in your legs and feet.  Your legs or feet are numb.  Your feet always feel cold.  You have pain around any toenails. Get help right away if:  You have a wound, scrape, corn, or callus on your foot and: ? You have pain, swelling, or redness that gets worse. ? You have fluid or blood coming from the wound, scrape, corn, or callus. ? Your wound, scrape, corn, or callus feels warm to the touch. ? You have pus or a bad smell coming  from the wound, scrape, corn, or callus. ? You have a fever. ? You have a red line going up your leg. Summary  Check your feet every day for blisters, cuts, bruises, sores, and redness.  Apply a moisturizing lotion or petroleum jelly to the skin on your feet and to dry, brittle toenails.  Wear shoes that fit properly and have enough cushioning.  If you have foot problems, report any cuts, sores, or bruises to your health care provider immediately.  Schedule a complete foot exam at least once a year (annually) or more often if you have foot problems. This information is not intended to replace advice given to you by your health care provider. Make sure you discuss any questions you have with your health care provider. Document Revised: 09/03/2019 Document Reviewed: 09/03/2019 Elsevier Patient Education  [place] Maintenance, Male Adopting a healthy lifestyle and getting preventive care are important in promoting health and wellness. Ask your health care provider about:  The right schedule for you to have regular tests and exams.  Things you can do on your own to prevent diseases and keep yourself healthy. What should I know about diet, weight, and exercise? Eat a healthy diet  Eat a diet that includes plenty of vegetables, fruits, low-fat dairy products, and lean protein.  Do not eat a lot of foods that are high in solid fats, added sugars, or sodium.   Maintain a healthy weight Body mass index (BMI) is a measurement that can be used to identify possible weight problems. It estimates body fat based on height and weight. Your health care provider can help determine your BMI and help you achieve or maintain a healthy weight. Get regular exercise Get regular exercise. This is one of the most important things you can do for your health. Most adults should:  Exercise for at least 150 minutes each week. The exercise should increase your heart rate and make you sweat  (moderate-intensity exercise).  Do strengthening exercises at least twice a week. This is in addition to the moderate-intensity exercise.  Spend less time sitting. Even light physical activity can be beneficial. Watch cholesterol and blood lipids Have your blood tested for lipids and cholesterol at 44 years of age, then have this test every 5 years. You may need to have your cholesterol levels checked more often if:  Your lipid or cholesterol levels are high.  You are older than 44 years of age.  You are at high risk for heart disease. What should I know about cancer screening? Many types of cancers can be detected early and may often be prevented. Depending on your health history and family history, you may need to have  cancer screening at various ages. This may include screening for:  Colorectal cancer.  Prostate cancer.  Skin cancer.  Lung cancer. What should I know about heart disease, diabetes, and high blood pressure? Blood pressure and heart disease  High blood pressure causes heart disease and increases the risk of stroke. This is more likely to develop in people who have high blood pressure readings, are of African descent, or are overweight.  Talk with your health care provider about your target blood pressure readings.  Have your blood pressure checked: ? Every 3-5 years if you are 24-78 years of age. ? Every year if you are 46 years old or older.  If you are between the ages of 56 and 63 and are a current or former smoker, ask your health care provider if you should have a one-time screening for abdominal aortic aneurysm (AAA). Diabetes Have regular diabetes screenings. This checks your fasting blood sugar level. Have the screening done:  Once every three years after age 41 if you are at a normal weight and have a low risk for diabetes.  More often and at a younger age if you are overweight or have a high risk for diabetes. What should I know about preventing  infection? Hepatitis B If you have a higher risk for hepatitis B, you should be screened for this virus. Talk with your health care provider to find out if you are at risk for hepatitis B infection. Hepatitis C Blood testing is recommended for:  Everyone born from [birth year] through 1964.  Anyone with known risk factors for hepatitis C. Sexually transmitted infections (STIs)  You should be screened each year for STIs, including gonorrhea and chlamydia, if: ? You are sexually active and are younger than 44 years of age. ? You are older than 44 years of age and your health care provider tells you that you are at risk for this type of infection. ? Your sexual activity has changed since you were last screened, and you are at increased risk for chlamydia or gonorrhea. Ask your health care provider if you are at risk.  Ask your health care provider about whether you are at high risk for HIV. Your health care provider may recommend a prescription medicine to help prevent HIV infection. If you choose to take medicine to prevent HIV, you should first get tested for HIV. You should then be tested every 3 months for as long as you are taking the medicine. Follow these instructions at home: Lifestyle  Do not use any products that contain nicotine or tobacco, such as cigarettes, e-cigarettes, and chewing tobacco. If you need help quitting, ask your health care provider.  Do not use street drugs.  Do not share needles.  Ask your health care provider for help if you need support or information about quitting drugs. Alcohol use  Do not drink alcohol if your health care provider tells you not to drink.  If you drink alcohol: ? Limit how much you have to 0-2 drinks a day. ? Be aware of how much alcohol is in your drink. In the [place] one drink equals one 12 oz bottle of beer (355 mL), one 5 oz glass of wine (148 mL), or one 1 oz glass of hard liquor (44 mL). General instructions  Schedule regular health,  dental, and eye exams.  Stay current with your vaccines.  Tell your health care provider if: ? You often feel depressed. ? You have ever been abused or do not feel  safe at home. Summary  Adopting a healthy lifestyle and getting preventive care are important in promoting health and wellness.  Follow your health care provider's instructions about healthy diet, exercising, and getting tested or screened for diseases.  Follow your health care provider's instructions on monitoring your cholesterol and blood pressure. This information is not intended to replace advice given to you by your health care provider. Make sure you discuss any questions you have with your health care provider. Document Revised: 02/05/2018 Document Reviewed: 02/05/2018 Elsevier Patient Education  2021 [employer]    MEDICARE [place] VISIT Health Maintenance Summary and Written Plan of Care  [name] ,  Thank you for allowing me to perform your Medicare Annual Wellness Visit and for your ongoing commitment to your health.   Health Maintenance & Immunization History Health Maintenance  Topic Date Due  . OPHTHALMOLOGY EXAM  03/18/2020 (Originally 12/02/1986)  . PNEUMOCOCCAL POLYSACCHARIDE VACCINE AGE 23-64 HIGH RISK  04/01/2020 (Originally 12/02/1978)  . COVID-19 Vaccine (1) 04/03/2020 (Originally 12/01/1981)  . URINE MICROALBUMIN  04/18/2020 (Originally 04/23/2019)  . INFLUENZA VACCINE  05/26/2020 (Originally 09/27/2019)  . TETANUS/TDAP  01/10/2021 (Originally 12/02/1995)  . FOOT EXAM  04/01/2020  . HEMOGLOBIN A1C  07/10/2020  . Hepatitis C Screening  Completed  . HIV Screening  Completed    There is no immunization history on file for this patient.  These are the patient goals that we discussed: Goals Addressed              This Visit's Progress   .  Patient Stated (pt-stated)        03/18/2020 AWV Goal: Exercise for General Health   Patient will verbalize understanding of the benefits  of increased physical activity:  Exercising regularly is important. It will improve your overall fitness, flexibility, and endurance.  Regular exercise also will improve your overall health. It can help you control your weight, reduce stress, and improve your bone density.  Over the next year, patient will increase physical activity as tolerated with a goal of at least 150 minutes of moderate physical activity per week.   You can tell that you are exercising at a moderate intensity if your heart starts beating faster and you start breathing faster but can still hold a conversation.  Moderate-intensity exercise ideas include:  Walking 1 mile (1.6 km) in about 15 minutes  Biking  Hiking  Golfing  Dancing  Water aerobics  Patient will verbalize understanding of everyday activities that increase physical activity by providing examples like the following: ? Yard work, such as: ? Pushing a Surveyor, mining ? Raking and bagging leaves ? Washing your car ? Pushing a stroller ? Shoveling snow ? Gardening ? Washing windows or floors  Patient will be able to explain general safety guidelines for exercising:   Before you start a new exercise program, talk with your health care provider.  Do not exercise so much that you hurt yourself, feel dizzy, or get very short of breath.  Wear comfortable clothes and wear shoes with good support.  Drink plenty of water while you exercise to prevent dehydration or heat stroke.  Work out until your breathing and your heartbeat get faster.         This is a list of Health Maintenance Items that are overdue or due now: There are no preventive care reminders to display for this patient.   Orders/Referrals Placed Today:   Follow-up Plan  . Follow-up with Sunnie Nielsen, DO as planned .  Schedule your appointment for flu vaccine, td vaccine and covid vaccine.  . Schedule your appointment for an eye exam.

## 2020-03-18 NOTE — Progress Notes (Addendum)
MEDICARE ANNUAL WELLNESS VISIT  03/18/2020  Telephone Visit Disclaimer This Medicare AWV was conducted by telephone due to national recommendations for restrictions regarding the COVID-19 Pandemic (e.g. social distancing).  I verified, using two identifiers, that I am speaking with Jonathan Rivera or their authorized healthcare agent. I discussed the limitations, risks, security, and privacy concerns of performing an evaluation and management service by telephone and the potential availability of an in-person appointment in the future. The patient expressed understanding and agreed to proceed.  Location of Patient: Home Location of Provider (nurse):  In the office.  Subjective:    Jonathan Rivera is a 45 y.o. male patient of Emeterio Reeve, DO who had a Medicare Annual Wellness Visit today via telephone. Saben is Disabled and lives alone. he has 0 children. he reports that he is socially active and does interact with friends/family regularly. he is minimally physically active and enjoys volunteering.  Patient Care Team: Emeterio Reeve, DO as PCP - General (Osteopathic Medicine)  Advanced Directives 03/18/2020  Does Patient Have a Medical Advance Directive? Yes  Type of Advance Directive Living will  Does patient want to make changes to medical advance directive? No - Patient declined    Hospital Utilization Over the Past 12 Months: # of hospitalizations or ER visits: 0 # of surgeries: 0  Review of Systems    Patient reports that his overall health is better compared to last year.  History obtained from chart review and the patient  Patient Reported Readings (BP, Pulse, CBG, Weight, etc) none  Pain Assessment Pain : No/denies pain     Current Medications & Allergies (verified) Allergies as of 03/18/2020   No Known Allergies     Medication List       Accurate as of March 18, 2020  4:24 PM. If you have any questions, ask your nurse or doctor.         Accu-Chek Guide test strip Generic drug: glucose blood USE UP TO 4 TIMES DAILY AS DIRECTED   blood glucose meter kit and supplies Kit Dispense based on patient and insurance preference. Use up to four times daily as directed. Please include lancets, test strips, control solution.   Ozempic (0.25 or 0.5 MG/DOSE) 2 MG/1.5ML Sopn Generic drug: Semaglutide(0.25 or 0.5MG/DOS) Inject 0.25 mg into the skin once a week for 28 days, THEN 0.5 mg once a week. Start taking on: January 11, 2020   Vitamin D (Ergocalciferol) 1.25 MG (50000 UNIT) Caps capsule Commonly known as: DRISDOL Take 1 capsule (50,000 Units total) by mouth every 7 (seven) days.       History (reviewed): Past Medical History:  Diagnosis Date  . CP (cerebral palsy) ([place]) 10/09/2013   Fine motor, gross motor, expressive language.    Past Surgical History:  Procedure Laterality Date  . FASCIAL DEFECT REPAIR     Family History  Problem Relation Age of Onset  . Stroke Mother   . Diabetes Father    Social History   Socioeconomic History  . Marital status: Single    Spouse name: Not on file  . Number of children: Not on file  . Years of education: 52  . Highest education level: Master's degree (e.g., MA, MS, MEng, MEd, MSW, MBA)  Occupational History  . Occupation: Disabled  Tobacco Use  . Smoking status: Never Smoker  . Smokeless tobacco: Never Used  Vaping Use  . Vaping Use: Never used  Substance and Sexual Activity  . Alcohol use: Not  Currently  . Drug use: No  . Sexual activity: Yes    Partners: Female    Birth control/protection: None  Other Topics Concern  . Not on file  Social History Narrative   Lives alone. Play board games. 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   Social Determinants of Health   Financial Resource Strain: Low Risk   . Difficulty of Paying Living Expenses: Not hard at all  Food Insecurity: No Food Insecurity  . Worried About Paediatric nurse in the Last Year: Never true  . Ran Out of Food in the Last Year: Never true  Transportation Needs: No Transportation Needs  . Lack of Transportation (Medical): No  . Lack of Transportation (Non-Medical): No  Physical Activity: Sufficiently Active  . Days of Exercise per Week: 5 days  . Minutes of Exercise per Session: 30 min  Stress: No Stress Concern Present  . Feeling of Stress : Not at all  Social Connections: Socially Isolated  . Frequency of Communication with Friends and Family: More than three times a week  . Frequency of Social Gatherings with Friends and Family: Three times a week  . Attends Religious Services: Never  . Active Member of Clubs or Organizations: No  . Attends Archivist Meetings: Never  . Marital Status: Never married    Activities of Daily Living In your present state of health, do you have any difficulty performing the following activities: 03/18/2020  Hearing? N  Vision? N  Difficulty concentrating or making decisions? N  Walking or climbing stairs? N  Dressing or bathing? N  Doing errands, shopping? N  Preparing Food and eating ? N  Using the Toilet? N  In the past six months, have you accidently leaked urine? N  Do you have problems with loss of bowel control? N  Managing your Medications? N  Managing your Finances? N  Housekeeping or managing your Housekeeping? N  Some recent data might be hidden    Patient Education/ Literacy How often do you need to have someone help you when you read instructions, pamphlets, or other written materials from your doctor or pharmacy?: 1 - Never What is the last grade level you completed in school?: Grad. School  Exercise Current Exercise Habits: Home exercise routine, Type of exercise: walking, Time (Minutes): 30, Frequency (Times/Week): 5, Weekly Exercise (Minutes/Week): 150, Intensity: Mild, Exercise limited by: None identified  Diet Patient reports consuming 1 meals a day and 0  snack(s) a day Patient reports that his primary diet is: Diabetic Patient reports that she does have regular access to food.   Depression Screen PHQ 2/9 Scores 03/18/2020 01/16/2018 10/03/2017  PHQ - 2 Score 0 0 0  PHQ- 9 Score 0 - 0     Fall Risk Fall Risk  03/18/2020  Falls in the past year? 0  Number falls in past yr: 0  Injury with Fall? 0  Risk for fall due to : No Fall Risks  Follow up Falls evaluation completed     Objective:  Jonathan Rivera seemed alert and oriented and he participated appropriately during our telephone visit.  Blood Pressure Weight BMI  BP Readings from Last 3 Encounters:  01/11/20 (!) 138/93  10/07/19 125/80  07/07/19 127/87   Wt Readings from Last 3 Encounters:  01/11/20 154 lb 0.6 oz (69.9 kg)  10/07/19 161 lb (73 kg)  07/07/19 162 lb 0.6 oz (73.5 kg)   BMI Readings from Last 1  Encounters:  01/11/20 24.13 kg/m    *Unable to obtain current vital signs, weight, and BMI due to telephone visit type  Hearing/Vision  . Devyn did not seem to have difficulty with hearing/understanding during the telephone conversation . Reports that he has not had a formal eye exam by an eye care professional within the past year . Reports that he has not had a formal hearing evaluation within the past year *Unable to fully assess hearing and vision during telephone visit type  Cognitive Function: 6CIT Screen 03/18/2020  What Year? 0 points  What month? 0 points  What time? 0 points  Count back from 20 0 points  Months in reverse 0 points  Repeat phrase 0 points  Total Score 0   (Normal:0-7, Significant for Dysfunction: >8)  Normal Cognitive Function Screening: Yes   Immunization & Health Maintenance Record  There is no immunization history on file for this patient.  Health Maintenance  Topic Date Due  . OPHTHALMOLOGY EXAM  03/18/2020 (Originally 12/02/1986)  . PNEUMOCOCCAL POLYSACCHARIDE VACCINE AGE 73-64 HIGH RISK  04/01/2020 (Originally 12/02/1978)  .  COVID-19 Vaccine (1) 04/03/2020 (Originally 12/01/1981)  . URINE MICROALBUMIN  04/18/2020 (Originally 04/23/2019)  . INFLUENZA VACCINE  05/26/2020 (Originally 09/27/2019)  . TETANUS/TDAP  01/10/2021 (Originally 12/02/1995)  . FOOT EXAM  04/01/2020  . HEMOGLOBIN A1C  07/10/2020  . Hepatitis C Screening  Completed  . HIV Screening  Completed       Assessment  This is a routine wellness examination for Jonathan Rivera.  Health Maintenance: Due or Overdue There are no preventive care reminders to display for this patient.  Jonathan Rivera does not need a referral for Community Assistance: Care Management:   no Social Work:    no Prescription Assistance:  no Nutrition/Diabetes Education:  no   Plan:  Personalized Goals Goals Addressed              This Visit's Progress   .  Patient Stated (pt-stated)        03/18/2020 AWV Goal: Exercise for General Health   Patient will verbalize understanding of the benefits of increased physical activity:  Exercising regularly is important. It will improve your overall fitness, flexibility, and endurance.  Regular exercise also will improve your overall health. It can help you control your weight, reduce stress, and improve your bone density.  Over the next year, patient will increase physical activity as tolerated with a goal of at least 150 minutes of moderate physical activity per week.   You can tell that you are exercising at a moderate intensity if your heart starts beating faster and you start breathing faster but can still hold a conversation.  Moderate-intensity exercise ideas include:  Walking 1 mile (1.6 km) in about 15 minutes  Biking  Hiking  Golfing  Dancing  Water aerobics  Patient will verbalize understanding of everyday activities that increase physical activity by providing examples like the following: ? Yard work, such as: ? Pushing a Conservation officer, nature ? Raking and bagging leaves ? Washing your car ? Pushing a  stroller ? Shoveling snow ? Gardening ? Washing windows or floors  Patient will be able to explain general safety guidelines for exercising:   Before you start a new exercise program, talk with your health care provider.  Do not exercise so much that you hurt yourself, feel dizzy, or get very short of breath.  Wear comfortable clothes and wear shoes with good support.  Drink plenty of water while  you exercise to prevent dehydration or heat stroke.  Work out until your breathing and your heartbeat get faster.       Personalized Health Maintenance & Screening Recommendations  Influenza vaccine Td vaccine Covid vaccine, Ophthamology exam   Lung Cancer Screening Recommended: no (Low Dose CT Chest recommended if Age 73-80 years, 30 pack-year currently smoking OR have quit w/in past 15 years) Hepatitis C Screening recommended: no HIV Screening recommended: no  Advanced Directives: Written information was not prepared per patient's request.  Referrals & Orders No orders of the defined types were placed in this encounter.   Follow-up Plan . Follow-up with Emeterio Reeve, DO as planned . Schedule your appointment for flu vaccine, td vaccine and covid vaccine.  . Schedule your appointment for an eye exam.   I have personally reviewed and noted the following in the patient's chart:   . Medical and social history . Use of alcohol, tobacco or illicit drugs  . Current medications and supplements . Functional ability and status . Nutritional status . Physical activity . Advanced directives . List of other physicians . Hospitalizations, surgeries, and ER visits in previous 12 months . Vitals . Screenings to include cognitive, depression, and falls . Referrals and appointments  In addition, I have reviewed and discussed with Jonathan Rivera certain preventive protocols, quality metrics, and best practice recommendations. A written personalized care plan for preventive  services as well as general preventive health recommendations is available and can be mailed to the patient at his request.      Tinnie Gens, RN  03/18/2020

## 2020-04-07 ENCOUNTER — Other Ambulatory Visit: Payer: Self-pay | Admitting: Osteopathic Medicine

## 2020-04-12 ENCOUNTER — Ambulatory Visit: Payer: Medicare HMO | Admitting: Osteopathic Medicine

## 2020-06-10 ENCOUNTER — Other Ambulatory Visit: Payer: Self-pay | Admitting: Osteopathic Medicine

## 2020-07-07 ENCOUNTER — Encounter: Payer: Self-pay | Admitting: Osteopathic Medicine

## 2020-07-07 ENCOUNTER — Other Ambulatory Visit: Payer: Self-pay

## 2020-07-07 ENCOUNTER — Ambulatory Visit (INDEPENDENT_AMBULATORY_CARE_PROVIDER_SITE_OTHER): Payer: Medicare HMO | Admitting: Osteopathic Medicine

## 2020-07-07 VITALS — BP 144/94 | HR 84 | Temp 98.0°F | Wt 152.1 lb

## 2020-07-07 DIAGNOSIS — E1129 Type 2 diabetes mellitus with other diabetic kidney complication: Secondary | ICD-10-CM

## 2020-07-07 DIAGNOSIS — R809 Proteinuria, unspecified: Secondary | ICD-10-CM | POA: Diagnosis not present

## 2020-07-07 LAB — POCT GLYCOSYLATED HEMOGLOBIN (HGB A1C): Hemoglobin A1C: 5.7 % — AB (ref 4.0–5.6)

## 2020-07-07 NOTE — Progress Notes (Signed)
Jonathan Rivera is a 45 y.o. male who presents to  [place] [facility]  today, 07/07/20, seeking care for the following:   DIABETES SCREENING/PREVENTIVE CARE: A1C:  10/03/17: 10.2% <-- no medications. Pt was very resistant to meds. -->Rx written for metformin to titrate up, advised RTC 6 weeks to recheck Glc but he did not follow-up.   01/16/18: 7.2% <--patient has not been taking the metformin, he has been really diligent about diet/exercise.  04/22/18: 7.0% <-- declines medication  07/22/18:6.7%   12/01/18: 6.3%<-- still doing well no meds, 155 lb   04/02/19: 7.2 --> pt would like to hold off on meds and recheck in 3 mos. Wt 157 lb  07/07/19:7.3 -->no change in Rx, Wt 162 lb   10/07/19:8.2 --> reported lacking consistent diet, opted to work on this, no change in Rx   01/11/20: 8.5 --> pt amenable to weekly injections, would like to avoid dialy medication since he's not on anything else on a daily basis. Rx Ozempic    Today 07/07/20: 5.7 on Ozempic, yay!   BP goal <130/80: BP Readings from Last 3 Encounters:  07/07/20 (!) 144/94  01/11/20 (!) 138/93  10/07/19 125/80   LDL goal <70:Noas of last check, due for labs Eye exam annually:none on file, importance discussed with patient Foot exam: needs Microalbuminuria:abnormal, declines meds Metformin:declined  ACE/ARB:declined Antiplatelet if ASCVD Risk >10%:declined Statin:declined Pneumovax:declined         ASSESSMENT & PLAN with other pertinent findings:  The encounter diagnosis was Type 2 diabetes mellitus with microalbuminuria, without long-term current use of insulin ([place]).   --.> continue DM2 meds, ozempic clearly helping! --> discussed BP goals, he is working on increasing exercise, owul dlike to defer meds for now    There are no Patient Instructions on file for this visit.  Orders Placed This Encounter  Procedures  . CBC  .  CMP14+EGFR  . Lipid panel  . POCT HgB A1C    No orders of the defined types were placed in this encounter.    See below for relevant physical exam findings  See below for recent lab and imaging results reviewed  Medications, allergies, PMH, PSH, SocH, [place] reviewed below    Follow-up instructions: Return in about 6 months (around 01/07/2021) for MONITOR A1C AND BP, SEE [place] SOONER IF NEEDED.                                        Exam:  BP (!) 144/94 (BP Location: Left Arm, Patient Position: Sitting, Cuff Size: Normal)   Pulse 84   Temp 98 F (36.7 C) (Oral)   Wt 152 lb 1.3 oz (69 kg)   BMI 23.82 kg/m   Constitutional: VS see above. General Appearance: alert, well-developed, well-nourished, NAD  Neck: No masses, trachea midline.   Respiratory: Normal respiratory effort. no wheeze, no rhonchi, no rales  Cardiovascular: S1/S2 normal, no murmur, no rub/gallop auscultated. RRR.   Musculoskeletal: Gait normal.   Neurological: Normal balance/coordination. No tremor.  Skin: warm, dry, intact.   Psychiatric: Normal judgment/insight. Normal mood and affect. Oriented x3.   Current Meds  Medication Sig  . ACCU-CHEK GUIDE test strip USE UP TO 4 TIMES DAILY AS DIRECTED  . blood glucose meter kit and supplies KIT Dispense based on patient and insurance preference. Use up to four times daily as directed. Please  include lancets, test strips, control solution.  Marland Kitchen OZEMPIC, 0.25 OR 0.5 MG/DOSE, 2 MG/1.5ML SOPN INJECT 0.5 MG INTO THE SKIN ONCE A WEEK.  . Vitamin D, Ergocalciferol, (DRISDOL) 1.25 MG (50000 UNIT) CAPS capsule Take 1 capsule (50,000 Units total) by mouth every 7 (seven) days.    No Known Allergies  Patient Active Problem List   Diagnosis Date Noted  . Type 2 diabetes mellitus with microalbuminuria ([place]) 10/26/2017  . Vitamin D deficiency 10/12/2013  . CP (cerebral palsy) ([place]) 10/09/2013  . Midline low back pain without sciatica  10/09/2013  . Chronic fatigue 10/09/2013    Family History  Problem Relation Age of Onset  . Stroke Mother   . Diabetes Father     Social History   Tobacco Use  Smoking Status Never Smoker  Smokeless Tobacco Never Used    Past Surgical History:  Procedure Laterality Date  . FASCIAL DEFECT REPAIR       There is no immunization history on file for this patient.  Recent Results (from the past 2160 hour(s))  POCT HgB A1C     Status: Abnormal   Collection Time: 07/07/20  2:43 PM  Result Value Ref Range   Hemoglobin A1C 5.7 (A) 4.0 - 5.6 %   HbA1c POC (<> result, manual entry)     HbA1c, POC (prediabetic range)     HbA1c, POC (controlled diabetic range)      No results found.     All questions at time of visit were answered - patient instructed to contact office with any additional concerns or updates. ER/RTC precautions were reviewed with the patient as applicable.   Please note: manual typing as well as voice recognition software may have been used to produce this document - typos may escape review. Please contact Dr. Sheppard Coil for any needed clarifications.

## 2020-07-08 ENCOUNTER — Other Ambulatory Visit: Payer: Self-pay | Admitting: Osteopathic Medicine

## 2020-07-09 ENCOUNTER — Other Ambulatory Visit: Payer: Self-pay | Admitting: Osteopathic Medicine

## 2020-12-14 ENCOUNTER — Other Ambulatory Visit: Payer: Self-pay | Admitting: Osteopathic Medicine

## 2021-01-09 ENCOUNTER — Ambulatory Visit: Payer: Medicare HMO | Admitting: Osteopathic Medicine

## 2021-01-20 IMAGING — MR MR LUMBAR SPINE W/O CM
4 of 5 series · 26 of 48 positions shown · non-contrast
Comparison: None.

CLINICAL DATA: Low back pain for over 6 weeks

EXAM:
MRI LUMBAR SPINE WITHOUT CONTRAST
TECHNIQUE: Multiplanar, multisequence MR imaging of the lumbar spine was
performed. No intravenous contrast was administered.

[Series 2: T2 · sagittal · 4.0mm · 0.81mm/px · 6 of 15 slices shown (1 of 2)]
[im 1/15]
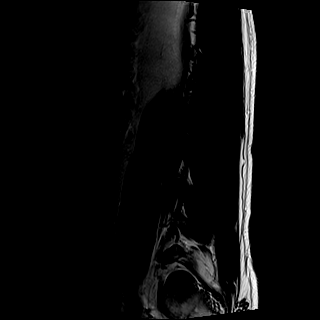
[im 3/15]
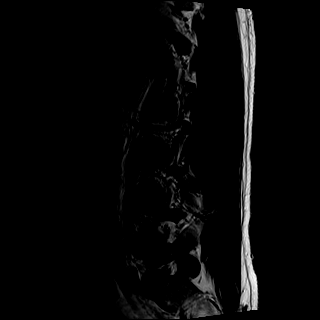
[im 6/15]
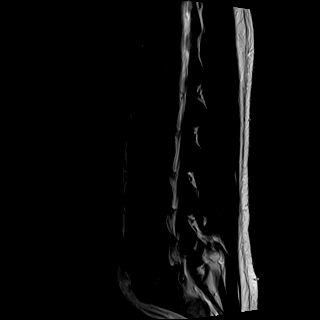
[im 9/15]
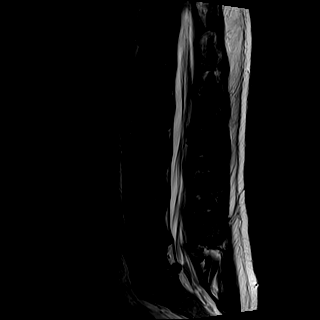
[im 12/15]
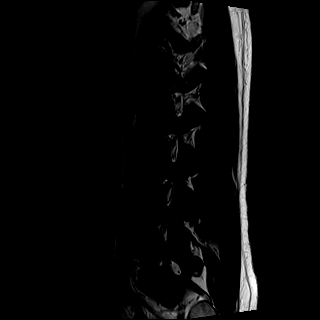
[im 15/15]
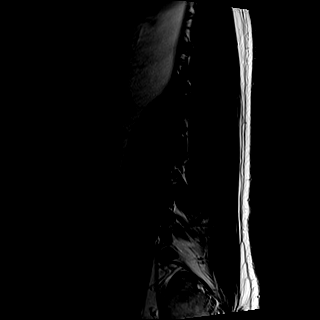

[Series 3: T1 · sagittal · 4.0mm · 0.41mm/px · 6 of 15 slices shown (1 of 2)]
[im 1/15]
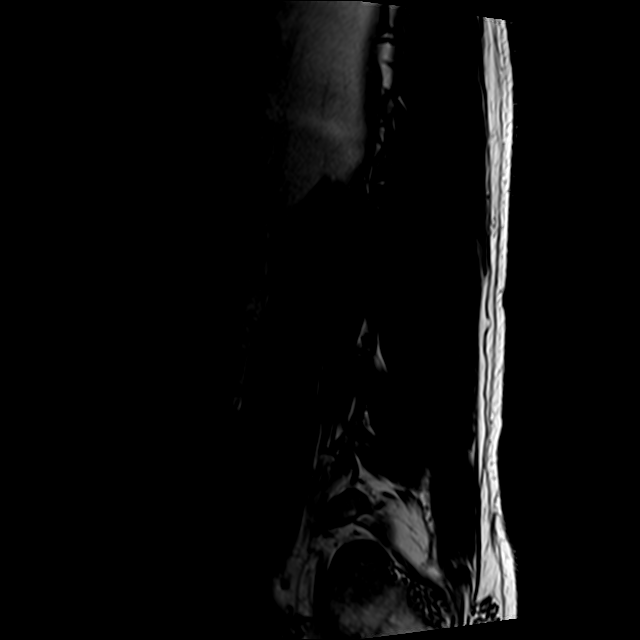
[im 3/15]
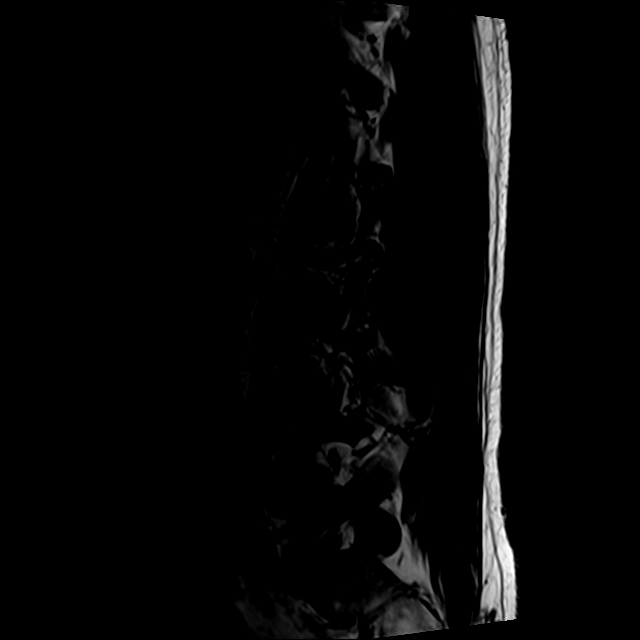
[im 6/15]
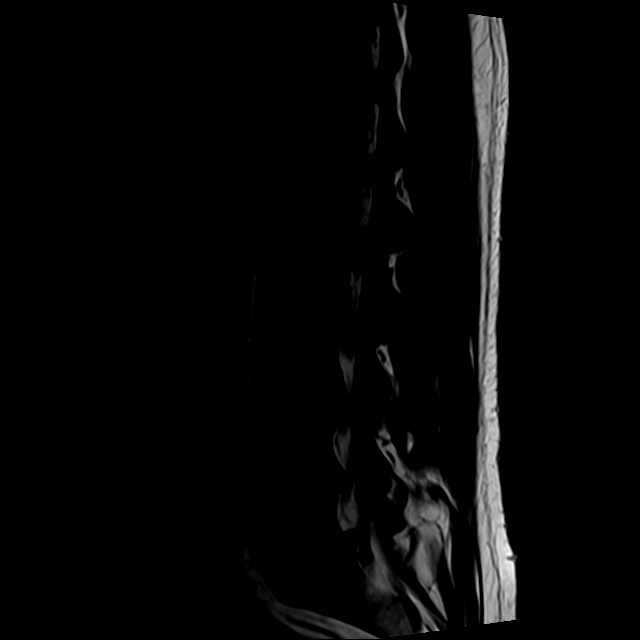
[im 9/15]
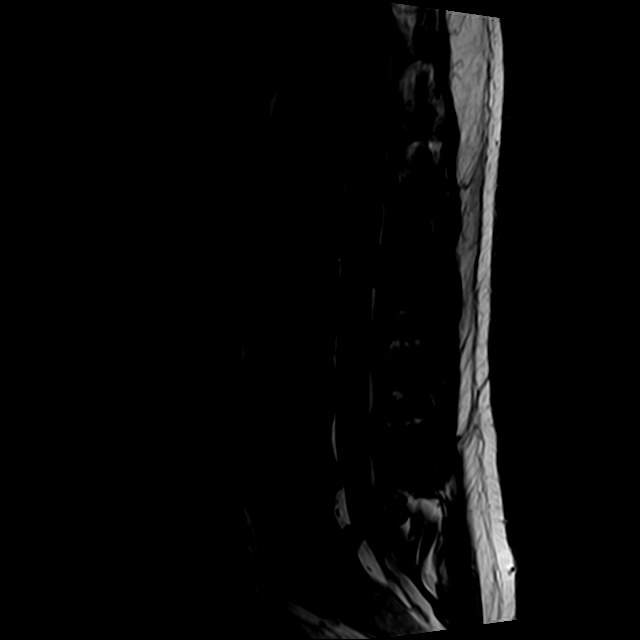
[im 12/15]
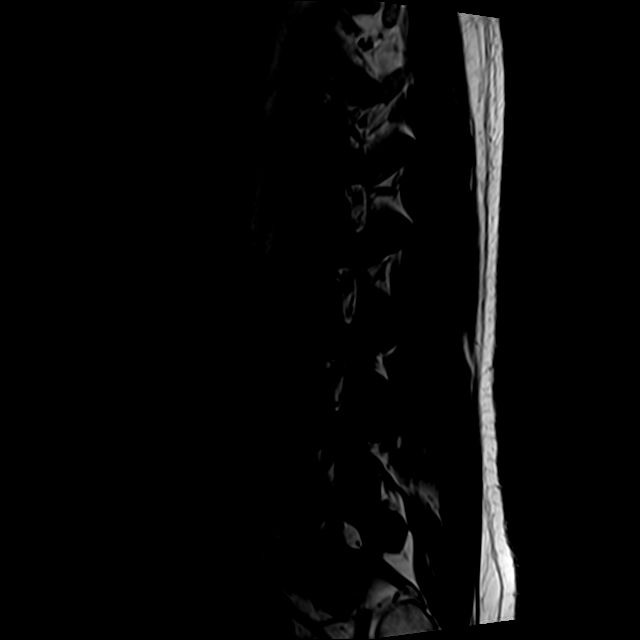
[im 15/15]
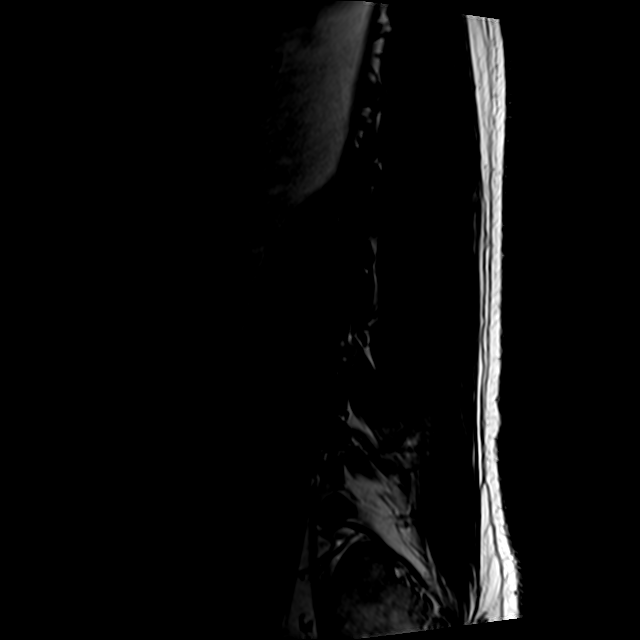

[Series 5: T2 · axial · 4.0mm · 0.78mm/px · z∈[-73,+142]mm · 9 of 40 slices shown (2 of 2)]
[im 1/40]
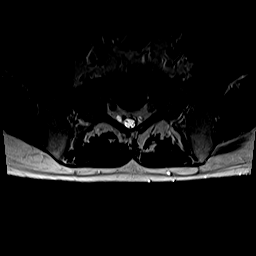
[im 6/40]
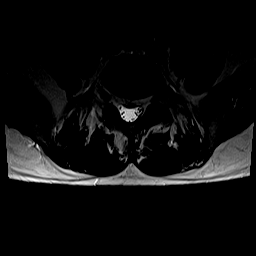
[im 12/40]
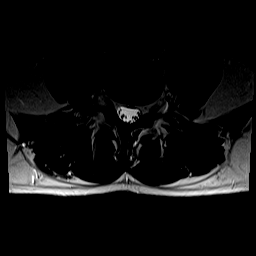
[im 17/40]
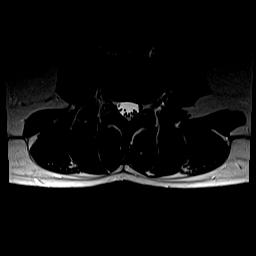
[im 20/40]
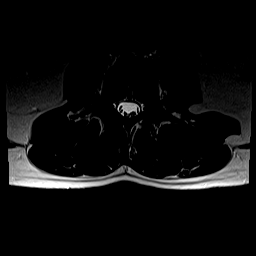
[im 23/40]
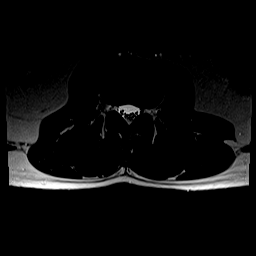
[im 28/40]
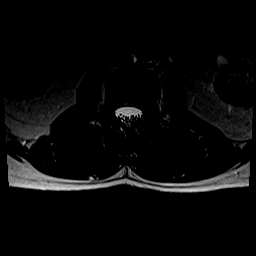
[im 34/40]
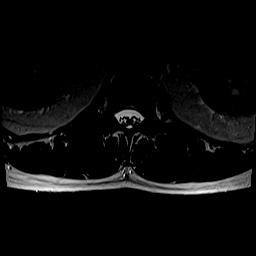
[im 40/40]
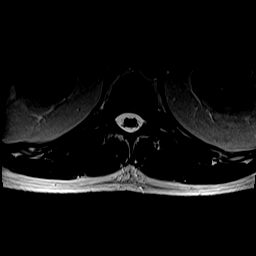

[Series 6: T1 · axial · 4.0mm · 0.39mm/px · z∈[-73,+112]mm · 5 of 40 slices shown (2 of 2)]
[im 1/40]
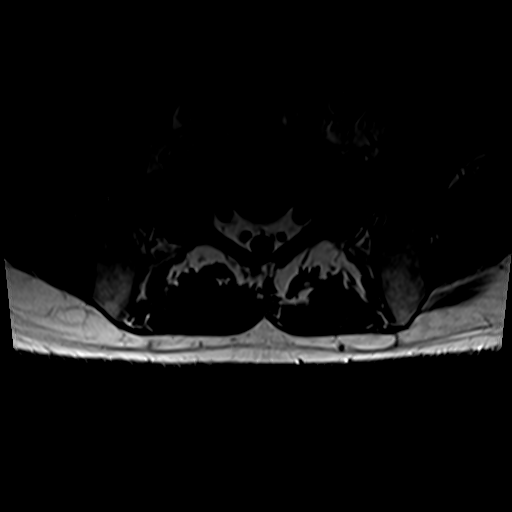
[im 6/40]
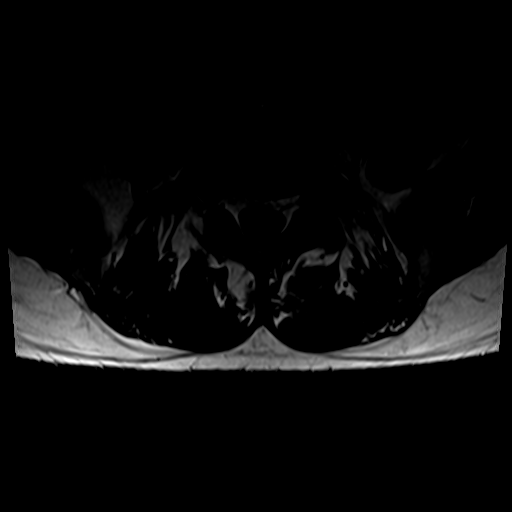
[im 12/40]
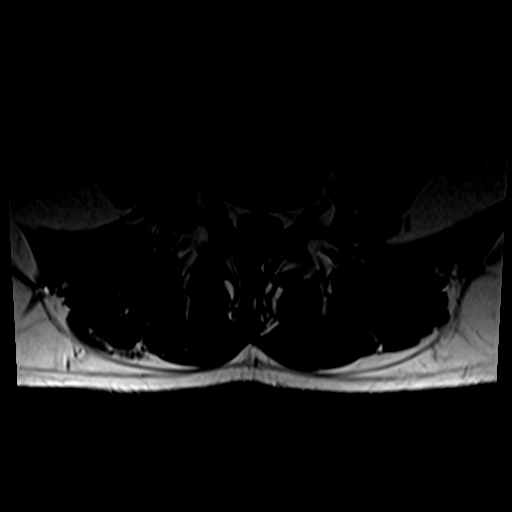
[im 20/40]
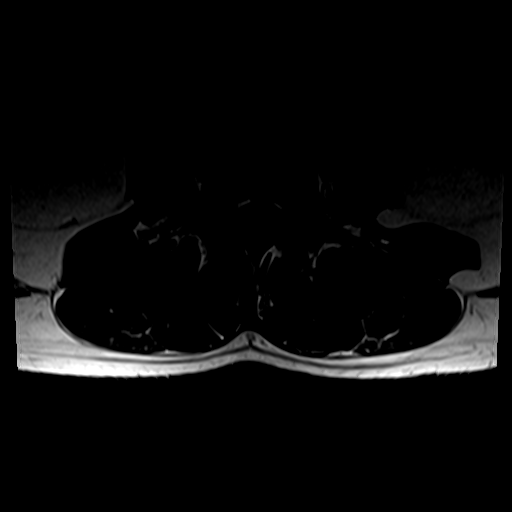
[im 34/40]
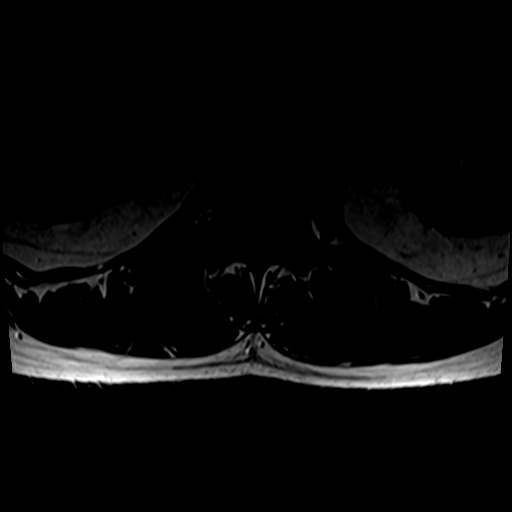

[26 of 48 positions shown; findings below may reference images not displayed]

FINDINGS: Segmentation:  5 lumbar type vertebrae

Alignment:  Slight anterolisthesis at L5-S1

Vertebrae: Mild discogenic marrow alteration at L5-S1. No fracture,
discitis, or aggressive bone lesion.

Conus medullaris and cauda equina: Conus extends to the L1-2 level.
Conus and cauda equina appear normal.

Paraspinal and other soft tissues: Negative

Disc levels:

T12- L1: Unremarkable.

L1-L2: Unremarkable.

L2-L3: Unremarkable.

L3-L4: Unremarkable.

L4-L5: Disc narrowing and desiccation with left eccentric protrusion
which may contact the descending L5 nerve root. The foramina are
patent

L5-S1:Greatest level of degenerative disc narrowing with endplate
degeneration and broad central protrusion that is noncompressive.
Minor facet spurring. Patent foramina.
IMPRESSION: 1. L4-5 left paracentral protrusion which could contact but does not
compress the L5 nerve root.
2. L5-S1 moderate disc degeneration with noncompressive central
protrusion.

## 2021-01-21 ENCOUNTER — Other Ambulatory Visit: Payer: Self-pay | Admitting: Osteopathic Medicine

## 2021-02-22 ENCOUNTER — Ambulatory Visit: Payer: Medicare HMO | Admitting: Family Medicine

## 2021-04-04 ENCOUNTER — Encounter: Payer: Self-pay | Admitting: Family Medicine

## 2021-04-04 ENCOUNTER — Ambulatory Visit (INDEPENDENT_AMBULATORY_CARE_PROVIDER_SITE_OTHER): Payer: Medicare HMO | Admitting: Family Medicine

## 2021-04-04 ENCOUNTER — Other Ambulatory Visit: Payer: Self-pay

## 2021-04-04 VITALS — BP 154/92 | HR 94 | Temp 98.3°F | Ht 66.0 in | Wt 157.0 lb

## 2021-04-04 DIAGNOSIS — R809 Proteinuria, unspecified: Secondary | ICD-10-CM

## 2021-04-04 DIAGNOSIS — I10 Essential (primary) hypertension: Secondary | ICD-10-CM

## 2021-04-04 DIAGNOSIS — N529 Male erectile dysfunction, unspecified: Secondary | ICD-10-CM | POA: Insufficient documentation

## 2021-04-04 DIAGNOSIS — E1129 Type 2 diabetes mellitus with other diabetic kidney complication: Secondary | ICD-10-CM

## 2021-04-04 MED ORDER — LOSARTAN POTASSIUM 50 MG PO TABS: 50.0000 mg | ORAL_TABLET | Freq: Every day | ORAL | 0 refills | Status: DC

## 2021-04-04 MED ORDER — SILDENAFIL CITRATE 100 MG PO TABS: 50.0000 mg | ORAL_TABLET | Freq: Every day | ORAL | 11 refills | Status: DC | PRN

## 2021-04-04 NOTE — Assessment & Plan Note (Signed)
Current management with Ozempic.  Tolerating this well.  Updating A1c today and will make adjustments as needed.

## 2021-04-04 NOTE — Assessment & Plan Note (Signed)
Discussed trial of sildenafil.  Precautions and red flag side effects reviewed including avoidance of nitrates and prolonged erection.

## 2021-04-04 NOTE — Progress Notes (Signed)
Jonathan BrownerJohn P Rivera - 46 y.o. male MRN [id number]  Date of birth: 02/18/1976  Subjective Chief Complaint  Patient presents with   Diabetes    HPI Jonathan RuizJohn is a 46 year old male here today for follow-up visit.  He is a former patient of Dr. Lyn HollingsheadAlexander.  He has history of type 2 diabetes as well as mild cerebral palsy.  Reports that his mother was diagnosed with stage IV lung cancer and is currently receiving hospice care.  Unfortunately he is put his own health on the back burner to help care for her over the past few months.  He has been consistent with his medication use but is not diligent with his diet or exercise.  He would not be surprised if his A1c is significantly elevated.  Blood pressure is elevated today.  He is not currently on any medication for management of hypertension.  He has had some difficulty with erections.  Interested in trying medication to help with this.  ROS:  A comprehensive ROS was completed and negative except as noted per HPI   No Known Allergies  Past Medical History:  Diagnosis Date   CP (cerebral palsy) ([facility]) 10/09/2013   Fine motor, gross motor, expressive language.     Past Surgical History:  Procedure Laterality Date   FASCIAL DEFECT REPAIR      Social History   Socioeconomic History   Marital status: Single    Spouse name: Not on file   Number of children: Not on file   Years of education: 6318   Highest education level: Master's degree (e.g., MA, MS, MEng, MEd, MSW, MBA)  Occupational History   Occupation: Disabled  Tobacco Use   Smoking status: Never   Smokeless tobacco: Never  Vaping Use   Vaping Use: Never used  Substance and Sexual Activity   Alcohol use: Not Currently   Drug use: No   Sexual activity: Yes    Partners: Female    Birth control/protection: None  Other Topics Concern   Not on file  Social History Narrative   Lives alone. Play board games. 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 Has 2 cats.    Social Determinants of Health   Financial Resource Strain: Not on file  Food Insecurity: Not on file  Transportation Needs: Not on file  Physical Activity: Not on file  Stress: Not on file  Social Connections: Not on file    Family History  Problem Relation Age of Onset   Stroke Mother    Diabetes Father     Health Maintenance  Topic Date Due   COVID-19 Vaccine (1) Never done   OPHTHALMOLOGY EXAM  Never done   TETANUS/TDAP  Never done   URINE MICROALBUMIN  04/23/2019   INFLUENZA VACCINE  Never done   HEMOGLOBIN A1C  01/07/2021   FOOT EXAM  04/04/2022   Hepatitis C Screening  Completed   HIV Screening  Completed   HPV VACCINES  Aged Out     ----------------------------------------------------------------------------------------------------------------------------------------------------------------------------------------------------------------- Physical Exam BP (!) 154/92 (BP Location: Left Arm, Patient Position: Sitting, Cuff Size: Small)    Pulse 94    Temp 98.3 F (36.8 C)    Ht 5\' 6"  (1.676 m)    Wt 157 lb (71.2 kg)    SpO2 99%    BMI 25.34 kg/m   Physical Exam Constitutional:      Appearance: Normal appearance.  Cardiovascular:     Rate and Rhythm: Normal rate and regular rhythm.  Pulmonary:  Effort: Pulmonary effort is normal.     Breath sounds: Normal breath sounds.  Musculoskeletal:     Cervical back: Neck supple.  Neurological:     General: No focal deficit present.     Mental Status: He is alert.  Psychiatric:        Mood and Affect: Mood normal.        Behavior: Behavior normal.    ------------------------------------------------------------------------------------------------------------------------------------------------------------------------------------------------------------------- Assessment and Plan  Type 2 diabetes mellitus with microalbuminuria ([place]) Current management with Ozempic.  Tolerating this well.  Updating A1c today  and will make adjustments as needed.  Erectile dysfunction Discussed trial of sildenafil.  Precautions and red flag side effects reviewed including avoidance of nitrates and prolonged erection.  Essential hypertension Blood pressure is elevated.  Adding losartan for both blood pressure control and renal protection.   Meds ordered this encounter  Medications   losartan (COZAAR) 50 MG tablet    Sig: Take 1 tablet (50 mg total) by mouth daily.    Dispense:  90 tablet    Refill:  0   sildenafil (VIAGRA) 100 MG tablet    Sig: Take 0.5-1 tablets (50-100 mg total) by mouth daily as needed for erectile dysfunction.    Dispense:  5 tablet    Refill:  11    Return in about 4 months (around 08/02/2021) for HTN/DM.    This visit occurred during the SARS-CoV-2 public health emergency.  Safety protocols were in place, including screening questions prior to the visit, additional usage of staff PPE, and extensive cleaning of exam room while observing appropriate contact time as indicated for disinfecting solutions.

## 2021-04-04 NOTE — Assessment & Plan Note (Signed)
Blood pressure is elevated.  Adding losartan for both blood pressure control and renal protection.

## 2021-04-04 NOTE — Patient Instructions (Signed)
Start losartan daily for blood pressure and kidney protection.  We'll be in touch with lab results.

## 2021-04-05 LAB — COMPLETE METABOLIC PANEL WITH GFR
AG Ratio: 1.8 (calc) (ref 1.0–2.5)
ALT: 40 U/L (ref 9–46)
AST: 23 U/L (ref 10–40)
Albumin: 4.5 g/dL (ref 3.6–5.1)
Alkaline phosphatase (APISO): 67 U/L (ref 36–130)
BUN: 12 mg/dL (ref 7–25)
CO2: 31 mmol/L (ref 20–32)
Calcium: 9.7 mg/dL (ref 8.6–10.3)
Chloride: 106 mmol/L (ref 98–110)
Creat: 0.94 mg/dL (ref 0.60–1.29)
Globulin: 2.5 g/dL (calc) (ref 1.9–3.7)
Glucose, Bld: 130 mg/dL — ABNORMAL HIGH (ref 65–99)
Potassium: 4.4 mmol/L (ref 3.5–5.3)
Sodium: 144 mmol/L (ref 135–146)
Total Bilirubin: 0.8 mg/dL (ref 0.2–1.2)
Total Protein: 7 g/dL (ref 6.1–8.1)
eGFR: 103 mL/min/{1.73_m2} (ref >=60)

## 2021-04-05 LAB — LIPID PANEL W/REFLEX DIRECT LDL
Cholesterol: 154 mg/dL (ref <200)
HDL: 40 mg/dL (ref >=40)
LDL Cholesterol (Calc): 91 mg/dL (calc)
Non-HDL Cholesterol (Calc): 114 mg/dL (calc) (ref <130)
Total CHOL/HDL Ratio: 3.9 (calc) (ref <5.0)
Triglycerides: 124 mg/dL (ref <150)

## 2021-04-05 LAB — HEMOGLOBIN A1C
Hgb A1c MFr Bld: 7.3 % of total Hgb — ABNORMAL HIGH (ref <5.7)
Mean Plasma Glucose: 163 mg/dL
eAG (mmol/L): 9 mmol/L

## 2021-04-05 LAB — CBC WITH DIFFERENTIAL/PLATELET
Absolute Monocytes: 752 cells/uL (ref 200–950)
Basophils Absolute: 30 cells/uL (ref 0–200)
Basophils Relative: 0.4 %
Eosinophils Absolute: 258 cells/uL (ref 15–500)
Eosinophils Relative: 3.4 %
HCT: 44.6 % (ref 38.5–50.0)
Hemoglobin: 14.8 g/dL (ref 13.2–17.1)
Lymphs Abs: 2554 cells/uL (ref 850–3900)
MCH: 28.2 pg (ref 27.0–33.0)
MCHC: 33.2 g/dL (ref 32.0–36.0)
MCV: 85.1 fL (ref 80.0–100.0)
MPV: 11.3 fL (ref 7.5–12.5)
Monocytes Relative: 9.9 %
Neutro Abs: 4005 cells/uL (ref 1500–7800)
Neutrophils Relative %: 52.7 %
Platelets: 228 10*3/uL (ref 140–400)
RBC: 5.24 10*6/uL (ref 4.20–5.80)
RDW: 12.8 % (ref 11.0–15.0)
Total Lymphocyte: 33.6 %
WBC: 7.6 10*3/uL (ref 3.8–10.8)

## 2021-05-02 ENCOUNTER — Ambulatory Visit (INDEPENDENT_AMBULATORY_CARE_PROVIDER_SITE_OTHER): Payer: Medicare HMO | Admitting: Family Medicine

## 2021-05-02 ENCOUNTER — Other Ambulatory Visit: Payer: Self-pay | Admitting: Family Medicine

## 2021-05-02 ENCOUNTER — Other Ambulatory Visit: Payer: Self-pay

## 2021-05-02 VITALS — BP 136/87 | HR 93

## 2021-05-02 DIAGNOSIS — I10 Essential (primary) hypertension: Secondary | ICD-10-CM

## 2021-05-02 NOTE — Progress Notes (Signed)
Pt here for nurse BP check.  Pt denies CP, SOB, headaches, dizziness.  States that he may have missed a couple of doses of losartan but he is not entirely sure.  Tiajuana Amass, CMA ? ?

## 2021-05-02 NOTE — Progress Notes (Signed)
Medical screening examination/treatment was performed by qualified clinical staff member and as supervising physician I was immediately available for consultation/collaboration. I have reviewed documentation and agree with assessment and plan.  Keawe Marcello, DO  

## 2021-05-03 LAB — BASIC METABOLIC PANEL
BUN: 14 mg/dL (ref 7–25)
CO2: 29 mmol/L (ref 20–32)
Calcium: 9.9 mg/dL (ref 8.6–10.3)
Chloride: 104 mmol/L (ref 98–110)
Creat: 0.99 mg/dL (ref 0.60–1.29)
Glucose, Bld: 156 mg/dL — ABNORMAL HIGH (ref 65–99)
Potassium: 4.4 mmol/L (ref 3.5–5.3)
Sodium: 141 mmol/L (ref 135–146)

## 2021-06-22 ENCOUNTER — Other Ambulatory Visit: Payer: Self-pay | Admitting: Family Medicine

## 2021-07-30 ENCOUNTER — Other Ambulatory Visit: Payer: Self-pay | Admitting: Family Medicine

## 2021-08-07 ENCOUNTER — Ambulatory Visit: Payer: Medicare HMO | Admitting: Family Medicine

## 2021-08-14 ENCOUNTER — Other Ambulatory Visit: Payer: Self-pay | Admitting: Family Medicine

## 2021-08-14 ENCOUNTER — Ambulatory Visit (INDEPENDENT_AMBULATORY_CARE_PROVIDER_SITE_OTHER): Payer: Medicare HMO | Admitting: Family Medicine

## 2021-08-14 DIAGNOSIS — Z0000 Encounter for general adult medical examination without abnormal findings: Secondary | ICD-10-CM | POA: Diagnosis not present

## 2021-08-14 MED ORDER — OZEMPIC (0.25 OR 0.5 MG/DOSE) 2 MG/1.5ML ~~LOC~~ SOPN: 0.5000 mg | PEN_INJECTOR | SUBCUTANEOUS | 1 refills | Status: DC

## 2021-08-14 NOTE — Progress Notes (Signed)
MEDICARE ANNUAL WELLNESS VISIT  08/14/2021  Telephone Visit Disclaimer This Medicare AWV was conducted by telephone due to national recommendations for restrictions regarding the COVID-19 Pandemic (e.g. social distancing).  I verified, using two identifiers, that I am speaking with Jonathan Rivera or their authorized healthcare agent. I discussed the limitations, risks, security, and privacy concerns of performing an evaluation and management service by telephone and the potential availability of an in-person appointment in the future. The patient expressed understanding and agreed to proceed.  Location of Patient: Home Location of Provider (nurse):  In the office.  Subjective:    Jonathan Rivera is a 46 y.o. male patient of Jonathan Nutting, DO who had a Medicare Annual Wellness Visit today via telephone. [name] is Disabled and lives alone. he does not have children. he reports that he is socially active and does interact with friends/family regularly. he is moderately physically active and enjoys playing board games and volunteering.  Patient Care Team: Jonathan Nutting, DO as PCP - General (Family Medicine)     08/14/2021   10:44 AM 03/18/2020    4:02 PM  Advanced Directives  Does Patient Have a Medical Advance Directive? No Yes  Type of Advance Directive  Living will  Does patient want to make changes to medical advance directive?  No - Patient declined  Would patient like information on creating a medical advance directive? No - Patient declined     Hospital Utilization Over the Past 12 Months: # of hospitalizations or ER visits: 0 # of surgeries: 0  Review of Systems    Patient reports that his overall health is worse compared to last year.  History obtained from chart review and the patient  Patient Reported Readings (BP, Pulse, CBG, Weight, etc) none  Pain Assessment Pain : No/denies pain     Current Medications & Allergies (verified) Allergies as of 08/14/2021   No  Known Allergies      Medication List        Accurate as of August 14, 2021 10:58 AM. If you have any questions, ask your nurse or doctor.          Accu-Chek Guide test strip Generic drug: glucose blood USE UP TO 4 TIMES DAILY AS DIRECTED   blood glucose meter kit and supplies Kit Dispense based on patient and insurance preference. Use up to four times daily as directed. Please include lancets, test strips, control solution.   losartan 50 MG tablet Commonly known as: COZAAR TAKE 1 TABLET BY MOUTH EVERY DAY   Ozempic (0.25 or 0.5 MG/DOSE) 2 MG/1.5ML Sopn Generic drug: Semaglutide(0.25 or 0.5MG/DOS) INJECT 0.5 MG INTO THE SKIN ONCE A WEEK.   sildenafil 100 MG tablet Commonly known as: Viagra Take 0.5-1 tablets (50-100 mg total) by mouth daily as needed for erectile dysfunction.   Vitamin D (Ergocalciferol) 1.25 MG (50000 UNIT) Caps capsule Commonly known as: DRISDOL Take 1 capsule (50,000 Units total) by mouth every 7 (seven) days.        History (reviewed): Past Medical History:  Diagnosis Date   CP (cerebral palsy) ([place]) 10/09/2013   Fine motor, gross motor, expressive language.    Past Surgical History:  Procedure Laterality Date   FASCIAL DEFECT REPAIR     Family History  Problem Relation Age of Onset   Stroke Mother    Diabetes Father    Social History   Socioeconomic History   Marital status: Single    Spouse name: Not on file  Number of children: Not on file   Years of education: 18   Highest education level: Master's degree (e.g., MA, MS, MEng, MEd, MSW, MBA)  Occupational History   Occupation: Disabled  Tobacco Use   Smoking status: Never   Smokeless tobacco: Never  Vaping Use   Vaping Use: Never used  Substance and Sexual Activity   Alcohol use: Not Currently   Drug use: No   Sexual activity: Yes    Partners: Female    Birth control/protection: None  Other Topics Concern   Not on file  Social History Narrative   Lives alone. Play  board games. 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   Social Determinants of Health   Financial Resource Strain: Low Risk  (08/14/2021)   Overall Financial Resource Strain (CARDIA)    Difficulty of Paying Living Expenses: Not very hard  Food Insecurity: No Food Insecurity (08/14/2021)   Hunger Vital Sign    Worried About Running Out of Food in the Last Year: Never true    Ran Out of Food in the Last Year: Never true  Transportation Needs: No Transportation Needs (08/14/2021)   PRAPARE - Hydrologist (Medical): No    Lack of Transportation (Non-Medical): No  Physical Activity: Sufficiently Active (08/14/2021)   Exercise Vital Sign    Days of Exercise per Week: 7 days    Minutes of Exercise per Session: 150+ min  Stress: No Stress Concern Present (08/14/2021)   [name]    Feeling of Stress : Only a little  Social Connections: Moderately Isolated (08/14/2021)   Social Connection and Isolation Panel [NHANES]    Frequency of Communication with Friends and Family: Three times a week    Frequency of Social Gatherings with Friends and Family: Three times a week    Attends Religious Services: Never    Active Member of Clubs or Organizations: Yes    Attends Archivist Meetings: More than 4 times per year    Marital Status: Never married    Activities of Daily Living    08/14/2021   12:25 AM  In your present state of health, do you have any difficulty performing the following activities:  Hearing? 0  Vision? 0  Difficulty concentrating or making decisions? 1  Walking or climbing stairs? 0  Dressing or bathing? 0  Doing errands, shopping? 0  Preparing Food and eating ? N  Using the Toilet? N  In the past six months, have you accidently leaked urine? N  Do you have problems with loss of bowel control? N  Managing your Medications? N  Managing  your Finances? N  Housekeeping or managing your Housekeeping? N    Patient Education/ Literacy How often do you need to have someone help you when you read instructions, pamphlets, or other written materials from your doctor or pharmacy?: 1 - Never What is the last grade level you completed in school?: Masters degree  Exercise Current Exercise Habits: Home exercise routine, Type of exercise: stretching;treadmill;strength training/weights, Time (Minutes): > 60, Frequency (Times/Week): 7, Weekly Exercise (Minutes/Week): 0, Intensity: Moderate, Exercise limited by: None identified  Diet Patient reports consuming 1 meals a day and 0 snack(s) a day Patient reports that his primary diet is: Regular Patient reports that she does have regular access to food.   Depression Screen    08/14/2021   10:46 AM 04/04/2021    1:51 PM 03/18/2020  4:04 PM 01/16/2018    2:26 PM 10/03/2017    1:48 PM  PHQ 2/9 Scores  PHQ - 2 Score 6 0 0 0 0  PHQ- 9 Score 16  0  0     Fall Risk    08/14/2021   10:46 AM 08/14/2021   12:25 AM 04/04/2021    1:51 PM 03/18/2020    4:02 PM  Fall Risk   Falls in the past year?  0 0 0  Number falls in past yr:  0 0 0  Injury with Fall?  0 0 0  Risk for fall due to : No Fall Risks  No Fall Risks No Fall Risks  Follow up Falls evaluation completed  Falls evaluation completed Falls evaluation completed     Objective:  Jonathan Rivera seemed alert and oriented and he participated appropriately during our telephone visit.  Blood Pressure Weight BMI  BP Readings from Last 3 Encounters:  05/02/21 136/87  04/04/21 (!) 154/92  07/07/20 (!) 144/94   Wt Readings from Last 3 Encounters:  04/04/21 157 lb (71.2 kg)  07/07/20 152 lb 1.3 oz (69 kg)  01/11/20 154 lb 0.6 oz (69.9 kg)   BMI Readings from Last 1 Encounters:  04/04/21 25.34 kg/m    *Unable to obtain current vital signs, weight, and BMI due to telephone visit type  Hearing/Vision  Abhinav did not seem to have  difficulty with hearing/understanding during the telephone conversation Reports that he has not had a formal eye exam by an eye care professional within the past year Reports that he has not had a formal hearing evaluation within the past year *Unable to fully assess hearing and vision during telephone visit type  Cognitive Function:    08/14/2021   10:52 AM 03/18/2020    4:07 PM  6CIT Screen  What Year? 0 points 0 points  What month? 0 points 0 points  What time? 0 points 0 points  Count back from 20 0 points 0 points  Months in reverse 0 points 0 points  Repeat phrase 2 points 0 points  Total Score 2 points 0 points   (Normal:0-7, Significant for Dysfunction: >8)  Normal Cognitive Function Screening: Yes   Immunization & Health Maintenance Record  There is no immunization history on file for this patient.  Health Maintenance  Topic Date Due   OPHTHALMOLOGY EXAM  08/14/2021 (Originally [birth year]1986)   COVID-19 Vaccine (1) 08/30/2021 (Originally 06/01/1976)   TETANUS/TDAP  05/18/2022 (Originally 12/02/1995)   INFLUENZA VACCINE  09/26/2021   HEMOGLOBIN A1C  10/02/2021   FOOT EXAM  04/04/2022   Hepatitis C Screening  Completed   HIV Screening  Completed   HPV VACCINES  Aged Out       Assessment  This is a routine wellness examination for Jonathan Rivera.  Health Maintenance: Due or Overdue There are no preventive care reminders to display for this patient.   Jonathan Rivera does not need a referral for Community Assistance: Care Management:   no Social Work:    no Prescription Assistance:  no Nutrition/Diabetes Education:  no   Plan:  Personalized Goals  Goals Addressed               This Visit's Progress     Patient Stated (pt-stated)        Patient stated he would like to loose 20 lbs and keep it off.       Personalized Health Maintenance & Screening Recommendations  Td vaccine  Eye exam  Lung Cancer Screening Recommended: no (Low Dose CT Chest  recommended if Age 39-80 years, 30 pack-year currently smoking OR have quit w/in past 15 years) Hepatitis C Screening recommended: no HIV Screening recommended: no  Advanced Directives: Written information was not prepared per patient's request.  Referrals & Orders No orders of the defined types were placed in this encounter.   Follow-up Plan Follow-up with Jonathan Nutting, DO as planned Schedule your tetanus shot at your pharmacy.  Diabetic screening clinic is on 11/12/21 and 12/28/21 at our office. Medicare wellness visit in one year. Patient will access AVS on my chart.   I have personally reviewed and noted the following in the patient's chart:   Medical and social history Use of alcohol, tobacco or illicit drugs  Current medications and supplements Functional ability and status Nutritional status Physical activity Advanced directives List of other physicians Hospitalizations, surgeries, and ER visits in previous 12 months Vitals Screenings to include cognitive, depression, and falls Referrals and appointments  In addition, I have reviewed and discussed with Jonathan Rivera certain preventive protocols, quality metrics, and best practice recommendations. A written personalized care plan for preventive services as well as general preventive health recommendations is available and can be mailed to the patient at his request.      Tinnie Gens, RN-BSN  08/14/2021

## 2021-08-14 NOTE — Patient Instructions (Addendum)
MEDICARE ANNUAL WELLNESS VISIT Health Maintenance Summary and Written Plan of Care  Jonathan Rivera ,  Thank you for allowing me to perform your Medicare Annual Wellness Visit and for your ongoing commitment to your health.   Health Maintenance & Immunization History Health Maintenance  Topic Date Due  . OPHTHALMOLOGY EXAM  08/14/2021 (Originally [birth year]1986)  . COVID-19 Vaccine (1) 08/30/2021 (Originally [birth year]1977)  . TETANUS/TDAP  05/18/2022 (Originally 12/02/1995)  . INFLUENZA VACCINE  09/26/2021  . HEMOGLOBIN A1C  10/02/2021  . FOOT EXAM  04/04/2022  . Hepatitis C Screening  Completed  . HIV Screening  Completed  . HPV VACCINES  Aged Out    There is no immunization history on file for this patient.  These are the patient goals that we discussed:  Goals Addressed              This Visit's Progress   .  Patient Stated (pt-stated)        Patient stated he would like to loose 20 lbs and keep it off.        This is a list of Health Maintenance Items that are overdue or due now: Td vaccine Eye exam  Orders/Referrals Placed Today: No orders of the defined types were placed in this encounter.  (Contact our referral department at [phone] if you have not spoken with someone about your referral appointment within the next 5 days)    Follow-up Plan Follow-up with Everrett Coombe, DO as planned Schedule your tetanus shot at your pharmacy.  Diabetic screening clinic is on 11/12/21 and 12/28/21 at our office. Medicare wellness visit in one year. Patient will access AVS on my chart.      Health Maintenance, Male Adopting a healthy lifestyle and getting preventive care are important in promoting health and wellness. Ask your health care provider about: The right schedule for you to have regular tests and exams. Things you can do on your own to prevent diseases and keep yourself healthy. What should I know about diet, weight, and exercise? Eat a healthy diet  Eat a diet  that includes plenty of vegetables, fruits, low-fat dairy products, and lean protein. Do not eat a lot of foods that are high in solid fats, added sugars, or sodium. Maintain a healthy weight Body mass index (BMI) is a measurement that can be used to identify possible weight problems. It estimates body fat based on height and weight. Your health care provider can help determine your BMI and help you achieve or maintain a healthy weight. Get regular exercise Get regular exercise. This is one of the most important things you can do for your health. Most adults should: Exercise for at least 150 minutes each week. The exercise should increase your heart rate and make you sweat (moderate-intensity exercise). Do strengthening exercises at least twice a week. This is in addition to the moderate-intensity exercise. Spend less time sitting. Even light physical activity can be beneficial. Watch cholesterol and blood lipids Have your blood tested for lipids and cholesterol at 45 years of age, then have this test every 5 years. You may need to have your cholesterol levels checked more often if: Your lipid or cholesterol levels are high. You are older than 45 years of age. You are at high risk for heart disease. What should I know about cancer screening? Many types of cancers can be detected early and may often be prevented. Depending on your health history and family history, you may need to have cancer screening  at various ages. This may include screening for: Colorectal cancer. Prostate cancer. Skin cancer. Lung cancer. What should I know about heart disease, diabetes, and high blood pressure? Blood pressure and heart disease High blood pressure causes heart disease and increases the risk of stroke. This is more likely to develop in people who have high blood pressure readings or are overweight. Talk with your health care provider about your target blood pressure readings. Have your blood pressure  checked: Every 3-5 years if you are 23-19 years of age. Every year if you are 84 years old or older. If you are between the ages of 12 and 65 and are a current or former smoker, ask your health care provider if you should have a one-time screening for abdominal aortic aneurysm (AAA). Diabetes Have regular diabetes screenings. This checks your fasting blood sugar level. Have the screening done: Once every three years after age 53 if you are at a normal weight and have a low risk for diabetes. More often and at a younger age if you are overweight or have a high risk for diabetes. What should I know about preventing infection? Hepatitis B If you have a higher risk for hepatitis B, you should be screened for this virus. Talk with your health care provider to find out if you are at risk for hepatitis B infection. Hepatitis C Blood testing is recommended for: Everyone born from [birth year] through 1964. Anyone with known risk factors for hepatitis C. Sexually transmitted infections (STIs) You should be screened each year for STIs, including gonorrhea and chlamydia, if: You are sexually active and are younger than 45 years of age. You are older than 45 years of age and your health care provider tells you that you are at risk for this type of infection. Your sexual activity has changed since you were last screened, and you are at increased risk for chlamydia or gonorrhea. Ask your health care provider if you are at risk. Ask your health care provider about whether you are at high risk for HIV. Your health care provider may recommend a prescription medicine to help prevent HIV infection. If you choose to take medicine to prevent HIV, you should first get tested for HIV. You should then be tested every 3 months for as long as you are taking the medicine. Follow these instructions at home: Alcohol use Do not drink alcohol if your health care provider tells you not to drink. If you drink alcohol: Limit how  much you have to 0-2 drinks a day. Know how much alcohol is in your drink. In the [place] one drink equals one 12 oz bottle of beer (355 mL), one 5 oz glass of wine (148 mL), or one 1 oz glass of hard liquor (44 mL). Lifestyle Do not use any products that contain nicotine or tobacco. These products include cigarettes, chewing tobacco, and vaping devices, such as e-cigarettes. If you need help quitting, ask your health care provider. Do not use street drugs. Do not share needles. Ask your health care provider for help if you need support or information about quitting drugs. General instructions Schedule regular health, dental, and eye exams. Stay current with your vaccines. Tell your health care provider if: You often feel depressed. You have ever been abused or do not feel safe at home. Summary Adopting a healthy lifestyle and getting preventive care are important in promoting health and wellness. Follow your health care provider's instructions about healthy diet, exercising, and getting tested or screened for  diseases. Follow your health care provider's instructions on monitoring your cholesterol and blood pressure. This information is not intended to replace advice given to you by your health care provider. Make sure you discuss any questions you have with your health care provider. Document Revised: 07/04/2020 Document Reviewed: 07/04/2020 Elsevier Patient Education  2023 [employer].

## 2021-08-15 ENCOUNTER — Telehealth: Payer: Self-pay

## 2021-08-15 NOTE — Telephone Encounter (Signed)
Opened in error

## 2021-09-05 ENCOUNTER — Telehealth: Payer: Self-pay

## 2021-09-05 ENCOUNTER — Ambulatory Visit (INDEPENDENT_AMBULATORY_CARE_PROVIDER_SITE_OTHER): Payer: Medicare HMO | Admitting: Family Medicine

## 2021-09-05 ENCOUNTER — Encounter: Payer: Self-pay | Admitting: Family Medicine

## 2021-09-05 VITALS — BP 149/79 | HR 102 | Ht 66.0 in | Wt 162.0 lb

## 2021-09-05 DIAGNOSIS — E1129 Type 2 diabetes mellitus with other diabetic kidney complication: Secondary | ICD-10-CM

## 2021-09-05 DIAGNOSIS — I10 Essential (primary) hypertension: Secondary | ICD-10-CM | POA: Diagnosis not present

## 2021-09-05 DIAGNOSIS — R809 Proteinuria, unspecified: Secondary | ICD-10-CM

## 2021-09-05 LAB — POCT UA - MICROALBUMIN
Creatinine, POC: 10 mg/dL
Microalbumin Ur, POC: 10 mg/L

## 2021-09-05 LAB — POCT GLYCOSYLATED HEMOGLOBIN (HGB A1C): HbA1c, POC (controlled diabetic range): 10 % — AB (ref 0.0–7.0)

## 2021-09-05 MED ORDER — OZEMPIC (0.25 OR 0.5 MG/DOSE) 2 MG/1.5ML ~~LOC~~ SOPN
0.5000 mg | PEN_INJECTOR | SUBCUTANEOUS | 1 refills | Status: DC
Start: 2021-09-05 — End: 2022-02-01

## 2021-09-05 MED ORDER — LOSARTAN POTASSIUM 50 MG PO TABS
50.0000 mg | ORAL_TABLET | Freq: Every day | ORAL | 3 refills | Status: DC
Start: 2021-09-05 — End: 2022-07-09

## 2021-09-05 NOTE — Telephone Encounter (Signed)
Pls contact pt to schedule for [facility] in September. Thanks

## 2021-09-05 NOTE — Assessment & Plan Note (Signed)
BP is stable at this time.  Continue losartan at current strength.

## 2021-09-05 NOTE — Patient Instructions (Signed)
Restart ozempic and use regularly.  Work on diet and exercise changes for better blood sugar control.   Take BP medication regularly.   See me again in 3 months.

## 2021-09-05 NOTE — Assessment & Plan Note (Signed)
Worsening control of diabetes.  Recommended that he restart ozempic and take regularly.  Will likely need titration to higher doses unless he works on exercise and diet diligently.

## 2021-09-05 NOTE — Progress Notes (Signed)
Jonathan Rivera - 46 y.o. male MRN [id number]  Date of birth: Aug 10, 1975  Subjective Chief Complaint  Patient presents with   Diabetes   Hypertension    HPI Jonathan Rivera is a 46 y.o. male here today for follow up visit.  He has not been taking good care of himself over the past several months.  His mother recently passed away.  He feels that now he can get himself back on track and can focus on his health.  He has been out of Ozempic for several weeks.  Diet has been poor and he has not been very active.    He is taking losartan as directed for HTN. BP is well controlled with this.    ROS:  A comprehensive ROS was completed and negative except as noted per HPI  No Known Allergies  Past Medical History:  Diagnosis Date   CP (cerebral palsy) (HCC) 10/09/2013   Fine motor, gross motor, expressive language.     Past Surgical History:  Procedure Laterality Date   FASCIAL DEFECT REPAIR      Social History   Socioeconomic History   Marital status: Single    Spouse name: Not on file   Number of children: Not on file   Years of education: 15   Highest education level: Master's degree (e.g., MA, MS, MEng, MEd, MSW, MBA)  Occupational History   Occupation: Disabled  Tobacco Use   Smoking status: Never   Smokeless tobacco: Never  Vaping Use   Vaping Use: Never used  Substance and Sexual Activity   Alcohol use: Not Currently   Drug use: No   Sexual activity: Yes    Partners: Female    Birth control/protection: None  Other Topics Concern   Not on file  Social History Narrative   Lives alone. Play board games. 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   Social Determinants of Health   Financial Resource Strain: Low Risk  (08/14/2021)   Overall Financial Resource Strain (CARDIA)    Difficulty of Paying Living Expenses: Not very hard  Food Insecurity: No Food Insecurity (08/14/2021)   Hunger Vital Sign    Worried About Running Out of  Food in the Last Year: Never true    Ran Out of Food in the Last Year: Never true  Transportation Needs: No Transportation Needs (08/14/2021)   PRAPARE - Administrator, Civil Service (Medical): No    Lack of Transportation (Non-Medical): No  Physical Activity: Sufficiently Active (08/14/2021)   Exercise Vital Sign    Days of Exercise per Week: 7 days    Minutes of Exercise per Session: 150+ min  Stress: No Stress Concern Present (08/14/2021)   [employer] of Occupational Health - Occupational Stress Questionnaire    Feeling of Stress : Only a little  Social Connections: Moderately Isolated (08/14/2021)   Social Connection and Isolation Panel [NHANES]    Frequency of Communication with Friends and Family: Three times a week    Frequency of Social Gatherings with Friends and Family: Three times a week    Attends Religious Services: Never    Active Member of Clubs or Organizations: Yes    Attends Engineer, structural: More than 4 times per year    Marital Status: Never married    Family History  Problem Relation Age of Onset   Stroke Mother    Diabetes Father     Health Maintenance  Topic Date Due  OPHTHALMOLOGY EXAM  12/06/2021 (Originally 12/02/1986)   COVID-19 Vaccine (1) 12/06/2021 (Originally 06/01/1977)   TETANUS/TDAP  05/18/2022 (Originally 12/02/1995)   INFLUENZA VACCINE  09/26/2021   HEMOGLOBIN A1C  03/08/2022   FOOT EXAM  04/04/2022   Diabetic kidney evaluation - GFR measurement  05/03/2022   Diabetic kidney evaluation - Urine ACR  09/06/2022   Hepatitis C Screening  Completed   HIV Screening  Completed   HPV VACCINES  Aged Out     ----------------------------------------------------------------------------------------------------------------------------------------------------------------------------------------------------------------- Physical Exam BP (!) 149/79 (BP Location: Left Arm, Patient Position: Sitting, Cuff Size: Normal)   Pulse  (!) 102   Ht 5\' 6"  (1.676 m)   Wt 162 lb (73.5 kg)   SpO2 96%   BMI 26.15 kg/m   Physical Exam Constitutional:      Appearance: Normal appearance.  Eyes:     General: No scleral icterus. Cardiovascular:     Rate and Rhythm: Normal rate and regular rhythm.  Pulmonary:     Effort: Pulmonary effort is normal.     Breath sounds: Normal breath sounds.  Musculoskeletal:     Cervical back: Neck supple.  Neurological:     Mental Status: He is alert.  Psychiatric:        Mood and Affect: Mood normal.        Behavior: Behavior normal.     ------------------------------------------------------------------------------------------------------------------------------------------------------------------------------------------------------------------- Assessment and Plan  Type 2 diabetes mellitus with microalbuminuria (HCC) Worsening control of diabetes.  Recommended that he restart ozempic and take regularly.  Will likely need titration to higher doses unless he works on exercise and diet diligently.    Essential hypertension BP is stable at this time.  Continue losartan at current strength.    Meds ordered this encounter  Medications   losartan (COZAAR) 50 MG tablet    Sig: Take 1 tablet (50 mg total) by mouth daily.    Dispense:  90 tablet    Refill:  3   Semaglutide,0.25 or 0.5MG /DOS, (OZEMPIC, 0.25 OR 0.5 MG/DOSE,) 2 MG/1.5ML SOPN    Sig: Inject 0.5 mg into the skin once a week.    Dispense:  5 mL    Refill:  1    Return in about 3 months (around 12/06/2021) for HTN/T2DM.    This visit occurred during the SARS-CoV-2 public health emergency.  Safety protocols were in place, including screening questions prior to the visit, additional usage of staff PPE, and extensive cleaning of exam room while observing appropriate contact time as indicated for disinfecting solutions.

## 2021-09-06 NOTE — Telephone Encounter (Signed)
Do we schedule these? I thought the [facility] group scheduled these? We do not have their full schedule to know what time slots to use.

## 2021-09-06 NOTE — Telephone Encounter (Signed)
Patient has been added to the schedule for [facility] retinal eye screening on 11/02/21.

## 2021-09-06 NOTE — Telephone Encounter (Signed)
I will have [facility] add the patient to the next [facility] schedule (11/02/21).

## 2021-11-02 LAB — HM DIABETES EYE EXAM

## 2021-11-03 ENCOUNTER — Encounter: Payer: Self-pay | Admitting: Family Medicine

## 2021-11-24 ENCOUNTER — Encounter: Payer: Self-pay | Admitting: Family Medicine

## 2021-12-07 ENCOUNTER — Ambulatory Visit: Payer: Medicare HMO | Admitting: Family Medicine

## 2022-02-01 ENCOUNTER — Other Ambulatory Visit: Payer: Self-pay | Admitting: Family Medicine

## 2022-02-07 ENCOUNTER — Other Ambulatory Visit: Payer: Self-pay | Admitting: Family Medicine

## 2022-03-07 ENCOUNTER — Ambulatory Visit (INDEPENDENT_AMBULATORY_CARE_PROVIDER_SITE_OTHER): Payer: Medicare HMO | Admitting: Family Medicine

## 2022-03-07 ENCOUNTER — Encounter: Payer: Self-pay | Admitting: Family Medicine

## 2022-03-07 VITALS — BP 111/76 | HR 97 | Ht 66.0 in | Wt 154.0 lb

## 2022-03-07 DIAGNOSIS — R809 Proteinuria, unspecified: Secondary | ICD-10-CM

## 2022-03-07 DIAGNOSIS — I10 Essential (primary) hypertension: Secondary | ICD-10-CM | POA: Diagnosis not present

## 2022-03-07 DIAGNOSIS — E1129 Type 2 diabetes mellitus with other diabetic kidney complication: Secondary | ICD-10-CM | POA: Diagnosis not present

## 2022-03-07 DIAGNOSIS — E559 Vitamin D deficiency, unspecified: Secondary | ICD-10-CM

## 2022-03-07 NOTE — Progress Notes (Signed)
Jonathan Rivera - 47 y.o. male MRN [id number]  Date of birth: [birth year]  Subjective Chief Complaint  Patient presents with   Diabetes    HPI Jonathan Rivera is a 47 year old male here today for follow-up visit.  Reports he is feeling pretty well.  Continues on Ozempic at 0.5 mg weekly for treatment of his diabetes.  He reports he is using this regularly.  He admits that diet has not been very good.  He is little upset as I would not sign off on him to donate plasma previously due to his uncontrolled diabetes.  Additionally, he informs me that his psychiatrist that he had previously had declined to sign off on plasma donation.  He reports that he uses this money to supplement his limited income.  Continues on losartan for management of hypertension.  Blood pressures been well-controlled with this.  No side effects at current strength.  ROS:  A comprehensive ROS was completed and negative except as noted per HPI  No Known Allergies  Past Medical History:  Diagnosis Date   CP (cerebral palsy) ([place]) 10/09/2013   Fine motor, gross motor, expressive language.     Past Surgical History:  Procedure Laterality Date   FASCIAL DEFECT REPAIR      Social History   Socioeconomic History   Marital status: Single    Spouse name: Not on file   Number of children: Not on file   Years of education: 82   Highest education level: Master's degree (e.g., MA, MS, MEng, MEd, MSW, MBA)  Occupational History   Occupation: Disabled  Tobacco Use   Smoking status: Never   Smokeless tobacco: Never  Vaping Use   Vaping Use: Never used  Substance and Sexual Activity   Alcohol use: Not Currently   Drug use: No   Sexual activity: Yes    Partners: Female    Birth control/protection: None  Other Topics Concern   Not on file  Social History Narrative   Lives alone. Play board games. 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   Social Determinants of Health   Financial  Resource Strain: Low Risk  (08/14/2021)   Overall Financial Resource Strain (CARDIA)    Difficulty of Paying Living Expenses: Not very hard  Food Insecurity: No Food Insecurity (08/14/2021)   Hunger Vital Sign    Worried About Running Out of Food in the Last Year: Never true    Ran Out of Food in the Last Year: Never true  Transportation Needs: No Transportation Needs (08/14/2021)   PRAPARE - Hydrologist (Medical): No    Lack of Transportation (Non-Medical): No  Physical Activity: Sufficiently Active (08/14/2021)   Exercise Vital Sign    Days of Exercise per Week: 7 days    Minutes of Exercise per Session: 150+ min  Stress: No Stress Concern Present (08/14/2021)   [place]    Feeling of Stress : Only a little  Social Connections: Moderately Isolated (08/14/2021)   Social Connection and Isolation Panel [NHANES]    Frequency of Communication with Friends and Family: Three times a week    Frequency of Social Gatherings with Friends and Family: Three times a week    Attends Religious Services: Never    Active Member of Clubs or Organizations: Yes    Attends Archivist Meetings: More than 4 times per year    Marital Status: Never married  Family History  Problem Relation Age of Onset   Stroke Mother    Diabetes Father     Health Maintenance  Topic Date Due   Fecal DNA (Cologuard)  Never done   INFLUENZA VACCINE  05/27/2022 (Originally 09/26/2021)   COVID-19 Vaccine (1) 03/24/2023 (Originally 06/01/1977)   HEMOGLOBIN A1C  03/08/2022   FOOT EXAM  04/04/2022   Diabetic kidney evaluation - eGFR measurement  05/03/2022   Medicare Annual Wellness (AWV)  08/15/2022   Diabetic kidney evaluation - Urine ACR  09/06/2022   OPHTHALMOLOGY EXAM  11/03/2022   DTaP/Tdap/Td (2 - Td or Tdap) 02/26/2026   Hepatitis C Screening  Completed   HIV Screening  Completed   HPV VACCINES  Aged Out      ----------------------------------------------------------------------------------------------------------------------------------------------------------------------------------------------------------------- Physical Exam BP 111/76 (BP Location: Left Arm, Patient Position: Sitting, Cuff Size: Normal)   Pulse 97   Ht 5\' 6"  (1.676 m)   Wt 154 lb (69.9 kg)   SpO2 100%   BMI 24.86 kg/m   Physical Exam Constitutional:      Appearance: Normal appearance.  HENT:     Head: Normocephalic and atraumatic.  Eyes:     General: No scleral icterus. Neurological:     Mental Status: He is alert.  Psychiatric:        Mood and Affect: Mood normal.        Behavior: Behavior normal.     ------------------------------------------------------------------------------------------------------------------------------------------------------------------------------------------------------------------- Assessment and Plan  Essential hypertension Blood pressure is well-controlled.  Recommend continuation of losartan at current strength.  Type 2 diabetes mellitus with microalbuminuria (HCC) Updating A1c.  Admits that diet has not been very good.  We discussed his clearance for plasma donation.  I let him know that I was not comfortable with signing off on this with his poorly controlled diabetes potentially increasing his risk of dehydration and complications after plasma donation.   No orders of the defined types were placed in this encounter.   Return in about 4 months (around 07/06/2022) for T2DM.    This visit occurred during the SARS-CoV-2 public health emergency.  Safety protocols were in place, including screening questions prior to the visit, additional usage of staff PPE, and extensive cleaning of exam room while observing appropriate contact time as indicated for disinfecting solutions.

## 2022-03-07 NOTE — Assessment & Plan Note (Signed)
Blood pressure is well-controlled.  Recommend continuation of losartan at current strength.

## 2022-03-07 NOTE — Patient Instructions (Signed)

## 2022-03-07 NOTE — Assessment & Plan Note (Signed)
Updating A1c.  Admits that diet has not been very good.  We discussed his clearance for plasma donation.  I let him know that I was not comfortable with signing off on this with his poorly controlled diabetes potentially increasing his risk of dehydration and complications after plasma donation.

## 2022-03-08 LAB — HEMOGLOBIN A1C
Hgb A1c MFr Bld: 11.1 % of total Hgb — ABNORMAL HIGH (ref <5.7)
Mean Plasma Glucose: 272 mg/dL
eAG (mmol/L): 15.1 mmol/L

## 2022-03-08 LAB — LIPID PANEL W/REFLEX DIRECT LDL
Cholesterol: 165 mg/dL (ref <200)
HDL: 40 mg/dL (ref >=40)
LDL Cholesterol (Calc): 99 mg/dL (calc)
Non-HDL Cholesterol (Calc): 125 mg/dL (calc) (ref <130)
Total CHOL/HDL Ratio: 4.1 (calc) (ref <5.0)
Triglycerides: 166 mg/dL — ABNORMAL HIGH (ref <150)

## 2022-03-08 LAB — CBC WITH DIFFERENTIAL/PLATELET
Absolute Monocytes: 697 cells/uL (ref 200–950)
Basophils Absolute: 17 cells/uL (ref 0–200)
Basophils Relative: 0.2 %
Eosinophils Absolute: 100 cells/uL (ref 15–500)
Eosinophils Relative: 1.2 %
HCT: 46.5 % (ref 38.5–50.0)
Hemoglobin: 15.8 g/dL (ref 13.2–17.1)
Lymphs Abs: 2540 cells/uL (ref 850–3900)
MCH: 28.8 pg (ref 27.0–33.0)
MCHC: 34 g/dL (ref 32.0–36.0)
MCV: 84.7 fL (ref 80.0–100.0)
MPV: 11.7 fL (ref 7.5–12.5)
Monocytes Relative: 8.4 %
Neutro Abs: 4947 cells/uL (ref 1500–7800)
Neutrophils Relative %: 59.6 %
Platelets: 217 10*3/uL (ref 140–400)
RBC: 5.49 10*6/uL (ref 4.20–5.80)
RDW: 12.7 % (ref 11.0–15.0)
Total Lymphocyte: 30.6 %
WBC: 8.3 10*3/uL (ref 3.8–10.8)

## 2022-03-08 LAB — COMPLETE METABOLIC PANEL WITH GFR
AG Ratio: 2 (calc) (ref 1.0–2.5)
ALT: 29 U/L (ref 9–46)
AST: 14 U/L (ref 10–40)
Albumin: 5 g/dL (ref 3.6–5.1)
Alkaline phosphatase (APISO): 87 U/L (ref 36–130)
BUN: 20 mg/dL (ref 7–25)
CO2: 30 mmol/L (ref 20–32)
Calcium: 10 mg/dL (ref 8.6–10.3)
Chloride: 103 mmol/L (ref 98–110)
Creat: 0.99 mg/dL (ref 0.60–1.29)
Globulin: 2.5 g/dL (calc) (ref 1.9–3.7)
Glucose, Bld: 166 mg/dL — ABNORMAL HIGH (ref 65–99)
Potassium: 4.4 mmol/L (ref 3.5–5.3)
Sodium: 141 mmol/L (ref 135–146)
Total Bilirubin: 1 mg/dL (ref 0.2–1.2)
Total Protein: 7.5 g/dL (ref 6.1–8.1)
eGFR: 96 mL/min/{1.73_m2} (ref >=60)

## 2022-03-08 LAB — VITAMIN D 25 HYDROXY (VIT D DEFICIENCY, FRACTURES): Vit D, 25-Hydroxy: 22 ng/mL — ABNORMAL LOW (ref 30–100)

## 2022-03-19 ENCOUNTER — Other Ambulatory Visit: Payer: Self-pay | Admitting: Family Medicine

## 2022-03-19 MED ORDER — SEMAGLUTIDE (1 MG/DOSE) 4 MG/3ML ~~LOC~~ SOPN: 1.0000 mg | PEN_INJECTOR | SUBCUTANEOUS | 1 refills | Status: DC

## 2022-05-10 ENCOUNTER — Other Ambulatory Visit: Payer: Self-pay | Admitting: Pharmacist

## 2022-05-10 NOTE — Progress Notes (Signed)
05/10/2022 Name: Jonathan Rivera MRN: [id number] DOB: 1976/02/20  Chief Complaint  Patient presents with   Diabetes   Hypertension    Jonathan Rivera is a 47 y.o. year old male who presented for a telephone visit.   They were referred to the pharmacist by a quality report for assistance in managing diabetes and hypertension.    Subjective:  Care Team: Primary Care Provider: Luetta Nutting, DO ; Next Scheduled Visit: 07/09/22   Medication Access/Adherence  [id number] [place], [place] [address]: [phone]: [phone]  Patient reports affordability concerns with their medications: Yes , cost of test strips have increased greatly Patient reports access/transportation concerns to their pharmacy: No  Patient reports adherence concerns with their medications:     Diabetes:  Current medications: ozempic '1mg'$  weekly. He reports no issues with shortages/supply at the pharmacy, has been doing 2 injections of 0.'5mg'$  each to equal '1mg'$  dosing, however has just picked up the RX for ozempic '1mg'$  weekly.  Current glucose readings: ~100, fasting. BG 115 during our phone call Using traditional meter; testing 1-2 times daily  Patient denies hypoglycemic s/sx including dizziness, shakiness, sweating. Patient reports occasional hyperglycemic symptoms including polyuria, polydipsia, polyphagia, nocturia, neuropathy, blurred vision.  Current meal patterns: eats ~once per day   Hypertension:  Current medications: losartan '50mg'$  daily  Patient does not have a validated, automated, upper arm home BP cuff Current blood pressure readings readings: not currently checking  Patient denies hypotensive s/sx including dizziness, lightheadedness.  Patient denies hypertensive symptoms including headache, chest pain, shortness of breath   Objective:  Lab Results  Component Value Date   HGBA1C 11.1 (H)  03/07/2022    Lab Results  Component Value Date   CREATININE 0.99 03/07/2022   BUN 20 03/07/2022   NA 141 03/07/2022   K 4.4 03/07/2022   CL 103 03/07/2022   CO2 30 03/07/2022    Lab Results  Component Value Date   CHOL 165 03/07/2022   HDL 40 03/07/2022   LDLCALC 99 03/07/2022   TRIG 166 (H) 03/07/2022   CHOLHDL 4.1 03/07/2022    Medications Reviewed Today     Reviewed by KDarius Bump REl Quiote(Pharmacist) on 05/10/22 at 1Bear RiverList Status: <None>   Medication Order Taking? Sig Documenting Provider Last Dose Status Informant  ACCU-CHEK GUIDE test strip [id number] USE UP TO 4 TIMES DAILY AS DIRECTED MLuetta Nutting DO Taking Active   blood glucose meter kit and supplies KIT [id number] Dispense based on patient and insurance preference. Use up to four times daily as directed. Please include lancets, test strips, control solution. AEmeterio Reeve DO Taking Active   losartan (COZAAR) 50 MG tablet [id number] Take 1 tablet (50 mg total) by mouth daily. MLuetta Nutting DO Taking Active   Semaglutide, 1 MG/DOSE, 4 MG/3ML SOPN [id number] Inject 1 mg as directed once a week. MLuetta Nutting DO Taking Active              Assessment/Plan:   Diabetes: - Currently unknown control, last A1c elevated however has been taking medicine consistently and fasting BG within goal <130.  - Reviewed long term cardiovascular and renal outcomes of uncontrolled blood sugar - Reviewed goal A1c, goal fasting, and goal 2 hour post prandial glucose - Recommend to continue current regimen  - Recommend to check glucose in morning, and 1-2x per week check 1-2 hours  after a meal with goal postprandial BG <180    Hypertension: - Currently controlled - Recommend to continue current regimen    Follow Up Plan: 1 month  Larinda Buttery, PharmD Clinical Pharmacist [facility] [phone]

## 2022-05-10 NOTE — Patient Instructions (Addendum)
Radek,  Thank you for speaking with me today! As discussed, keep taking your medication regularly and checking blood sugar in the mornings. About 1-2 times per week, consider checking after food, and we can discuss in the future.   Information about checking your blood sugars:  1) Fasting, first thing in the morning before breakfast and  2) 2 hours after your largest meal, several times a week  For a goal A1c of less than 7%, goal fasting readings are less than 130 and goal 2 hour after meal readings are less than 180.    Call the number on your insurance card to ask about preferred brand of test strips and diabetes supplies, and we can also adjust the prescription if there is a better option for cost.  Take care, Luana Shu, PharmD Clinical Pharmacist [facility] At [facility] [phone]

## 2022-06-13 ENCOUNTER — Other Ambulatory Visit: Payer: Medicare HMO | Admitting: Pharmacist

## 2022-06-13 NOTE — Progress Notes (Signed)
06/13/2022 Name: Jonathan Rivera MRN: [id number] DOB: 1976-02-24  No chief complaint on file.   RONNY KORFF is a 47 y.o. year old male who presented for a telephone visit.   They were referred to the pharmacist by a quality report for assistance in managing diabetes, hypertension, and quality metric: statin use in persons with diabetes (SUPD) .    Subjective:  Care Team: Primary Care Provider: Everrett Coombe, DO ; Next Scheduled Visit: 07/09/22   Medication Access/Adherence  [facility]  [id number] [place], [place] - [address] [place] [address] Phone: [phone] Fax: [phone]   Patient reports affordability concerns with their medications: Yes , cost of test strips have increased greatly. Patient states ozempic is covered well by insurance. Patient reports access/transportation concerns to their pharmacy: No  Patient reports adherence concerns with their medications:     Diabetes:  Current medications: ozempic  weekly. He reports no issues with shortages/supply at the pharmacy, has been doing 2 injections of 0.5mg  each to equal  dosing, however has just picked up the RX for ozempic  weekly.  Current glucose readings: fasting 80-100. BG postprandial spikes ~250.  Using traditional meter; testing 1-2 times daily  Patient denies hypoglycemic s/sx including dizziness, shakiness, sweating. Patient reports occasional hyperglycemic symptoms including polyuria, polydipsia, polyphagia, nocturia, neuropathy, blurred vision.  Current meal patterns: eats ~once per day.    Hypertension:  Current medications: losartan  daily  Patient does not have a validated, automated, upper arm home BP cuff Current blood pressure readings readings: not currently checking  Patient denies hypotensive s/sx including dizziness, lightheadedness.  Patient denies hypertensive symptoms including headache, chest pain, shortness of  breath  Hyperlipidemia/ASCVD Risk Reduction  Current lipid lowering medications: none at present, pt has declined in the past  ASCVD History: no major CV events Family History: stroke (mother), diabetes (father) Risk Factors: HTN, uncontrolled a1c   Objective:  Lab Results  Component Value Date   HGBA1C 11.1 (H) 03/07/2022    Lab Results  Component Value Date   CREATININE 0.99 03/07/2022   BUN 20 03/07/2022   NA 141 03/07/2022   K 4.4 03/07/2022   CL 103 03/07/2022   CO2 30 03/07/2022    Lab Results  Component Value Date   CHOL 165 03/07/2022   HDL 40 03/07/2022   LDLCALC 99 03/07/2022   TRIG 166 (H) 03/07/2022   CHOLHDL 4.1 03/07/2022    Medications Reviewed Today     Reviewed by Gabriel Carina, RPH (Pharmacist) on 06/13/22 at 1018  Med List Status: <None>   Medication Order Taking? Sig Documenting Provider Last Dose Status Informant  ACCU-CHEK GUIDE test strip [id number] Yes USE UP TO 4 TIMES DAILY AS DIRECTED Everrett Coombe, DO Taking Active   blood glucose meter kit and supplies KIT [id number] Yes Dispense based on patient and insurance preference. Use up to four times daily as directed. Please include lancets, test strips, control solution. Sunnie Nielsen, DO Taking Active   losartan (COZAAR) 50 MG tablet [id number] Yes Take 1 tablet (50 mg total) by mouth daily. Everrett Coombe, DO Taking Active   Semaglutide, 1 MG/DOSE, 4 MG/3ML SOPN [id number] Yes Inject 1 mg as directed once a week. Everrett Coombe, DO Taking Active              Assessment/Plan:   Diabetes: - Currently unknown control, last A1c elevated however has been taking medicine consistently and fasting BG within goal <130. Postprandial  BG are still elevated, reviewed nutrition choices, recommended patient increase to ozempic  weekly, he declines dose increase at this time. Consider dose increase at upcoming PCP visit. - Reviewed long term cardiovascular and renal outcomes of uncontrolled  blood sugar.  - Reviewed goal A1c, goal fasting, and goal 2 hour post prandial glucose - Recommend to continue current regimen  - Recommend to check glucose in morning, and 1-2x per week check 1-2 hours after a meal with goal postprandial BG <180  Hypertension: - Currently controlled - Recommend to continue current regimen   Hyperlipidemia/ASCVD Risk Reduction: - Currently controlled. Counseled patient on risk/benefits, pt made informed decision and declines statin at this time. Patient is failing quality measure for statin use in persons with diabetes (SUPD). Exclusion codes not applicable since patient has not trialed the medication. - Reviewed long term complications of uncontrolled cholesterol - Recommend to continue evaluating risk/benefit at annual intervals     Follow Up Plan: PRN  Lynnda Shields, PharmD Clinical Pharmacist [facility] Primary Care At [facility] [phone]

## 2022-07-09 ENCOUNTER — Encounter: Payer: Self-pay | Admitting: Family Medicine

## 2022-07-09 ENCOUNTER — Ambulatory Visit (INDEPENDENT_AMBULATORY_CARE_PROVIDER_SITE_OTHER): Payer: Medicare HMO | Admitting: Family Medicine

## 2022-07-09 VITALS — BP 129/83 | HR 105 | Ht 66.0 in | Wt 147.0 lb

## 2022-07-09 DIAGNOSIS — Z794 Long term (current) use of insulin: Secondary | ICD-10-CM

## 2022-07-09 DIAGNOSIS — R809 Proteinuria, unspecified: Secondary | ICD-10-CM | POA: Diagnosis not present

## 2022-07-09 DIAGNOSIS — E1129 Type 2 diabetes mellitus with other diabetic kidney complication: Secondary | ICD-10-CM

## 2022-07-09 LAB — POCT GLYCOSYLATED HEMOGLOBIN (HGB A1C): HbA1c, POC (controlled diabetic range): 6.6 % (ref 0.0–7.0)

## 2022-07-09 MED ORDER — LOSARTAN POTASSIUM 50 MG PO TABS: 50.0000 mg | ORAL_TABLET | Freq: Every day | ORAL | 3 refills | Status: DC

## 2022-07-09 NOTE — Assessment & Plan Note (Signed)
Diabetes is much better controlled.  Continue Ozempic at 1 mg weekly.  Encouraged continued dietary change.

## 2022-07-09 NOTE — Progress Notes (Signed)
Jonathan Rivera - 47 y.o. male MRN [id number]  Date of birth: 04-13-1975  Subjective Chief Complaint  Patient presents with   Diabetes   Ear Fullness    HPI Jonathan Rivera is a 47 year old male here today for follow-up visit.  Reports he is doing pretty well at this time.  He is taking Ozempic 1 mg weekly.  Tolerating this pretty well at this time.  A1c is down to 6.6%.  He has been working on some dietary changes in addition to the medication.  Denies nausea or vomiting at this time.  ROS:  A comprehensive ROS was completed and negative except as noted per HPI  No Known Allergies  Past Medical History:  Diagnosis Date   CP (cerebral palsy) (HCC) 10/09/2013   Fine motor, gross motor, expressive language.     Past Surgical History:  Procedure Laterality Date   FASCIAL DEFECT REPAIR      Social History   Socioeconomic History   Marital status: Single    Spouse name: Not on file   Number of children: Not on file   Years of education: 71   Highest education level: Master's degree (e.g., MA, MS, MEng, MEd, MSW, MBA)  Occupational History   Occupation: Disabled  Tobacco Use   Smoking status: Never   Smokeless tobacco: Never  Vaping Use   Vaping Use: Never used  Substance and Sexual Activity   Alcohol use: Not Currently   Drug use: No   Sexual activity: Yes    Partners: Female    Birth control/protection: None  Other Topics Concern   Not on file  Social History Narrative   Lives alone. Play board games. 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   Social Determinants of Health   Financial Resource Strain: Low Risk  (08/14/2021)   Overall Financial Resource Strain (CARDIA)    Difficulty of Paying Living Expenses: Not very hard  Food Insecurity: No Food Insecurity (08/14/2021)   Hunger Vital Sign    Worried About Running Out of Food in the Last Year: Never true    Ran Out of Food in the Last Year: Never true  Transportation Needs: No  Transportation Needs (08/14/2021)   PRAPARE - Administrator, Civil Service (Medical): No    Lack of Transportation (Non-Medical): No  Physical Activity: Sufficiently Active (08/14/2021)   Exercise Vital Sign    Days of Exercise per Week: 7 days    Minutes of Exercise per Session: 150+ min  Stress: No Stress Concern Present (08/14/2021)   [employer] of Occupational Health - Occupational Stress Questionnaire    Feeling of Stress : Only a little  Social Connections: Moderately Isolated (08/14/2021)   Social Connection and Isolation Panel [NHANES]    Frequency of Communication with Friends and Family: Three times a week    Frequency of Social Gatherings with Friends and Family: Three times a week    Attends Religious Services: Never    Active Member of Clubs or Organizations: Yes    Attends Engineer, structural: More than 4 times per year    Marital Status: Never married    Family History  Problem Relation Age of Onset   Stroke Mother    Diabetes Father     Health Maintenance  Topic Date Due   Fecal DNA (Cologuard)  Never done   Medicare Annual Wellness (AWV)  08/15/2022   COVID-19 Vaccine (1) 03/24/2023 (Originally 06/01/1976)   Diabetic kidney evaluation -  Urine ACR  09/06/2022   INFLUENZA VACCINE  09/27/2022   OPHTHALMOLOGY EXAM  11/03/2022   HEMOGLOBIN A1C  01/09/2023   Diabetic kidney evaluation - eGFR measurement  03/08/2023   FOOT EXAM  07/09/2023   DTaP/Tdap/Td (2 - Td or Tdap) 02/26/2026   Hepatitis C Screening  Completed   HIV Screening  Completed   HPV VACCINES  Aged Out     ----------------------------------------------------------------------------------------------------------------------------------------------------------------------------------------------------------------- Physical Exam BP 129/83 (BP Location: Left Arm, Patient Position: Sitting, Cuff Size: Small)   Pulse (!) 105   Ht 5\' 6"  (1.676 m)   Wt 147 lb (66.7 kg)   SpO2  99%   BMI 23.73 kg/m   Physical Exam Constitutional:      Appearance: Normal appearance.  Neurological:     Mental Status: He is alert.  Psychiatric:        Mood and Affect: Mood normal.        Behavior: Behavior normal.     ------------------------------------------------------------------------------------------------------------------------------------------------------------------------------------------------------------------- Assessment and Plan  Type 2 diabetes mellitus with microalbuminuria (HCC) Diabetes is much better controlled.  Continue Ozempic at 1 mg weekly.  Encouraged continued dietary change.   Meds ordered this encounter  Medications   losartan (COZAAR) 50 MG tablet    Sig: Take 1 tablet (50 mg total) by mouth daily.    Dispense:  90 tablet    Refill:  3    Return in about 4 months (around 11/09/2022) for T2DM.    This visit occurred during the SARS-CoV-2 public health emergency.  Safety protocols were in place, including screening questions prior to the visit, additional usage of staff PPE, and extensive cleaning of exam room while observing appropriate contact time as indicated for disinfecting solutions.

## 2022-08-20 ENCOUNTER — Ambulatory Visit (INDEPENDENT_AMBULATORY_CARE_PROVIDER_SITE_OTHER): Payer: Medicare HMO | Admitting: Family Medicine

## 2022-08-20 ENCOUNTER — Telehealth: Payer: Self-pay

## 2022-08-20 DIAGNOSIS — Z0000 Encounter for general adult medical examination without abnormal findings: Secondary | ICD-10-CM

## 2022-08-20 DIAGNOSIS — Z1211 Encounter for screening for malignant neoplasm of colon: Secondary | ICD-10-CM

## 2022-08-20 NOTE — Progress Notes (Signed)
MEDICARE ANNUAL WELLNESS VISIT  08/20/2022  Telephone Visit Disclaimer This Medicare AWV was conducted by telephone due to national recommendations for restrictions regarding the COVID-19 Pandemic (e.g. social distancing).  I verified, using two identifiers, that I am speaking with Jonathan Rivera or their authorized healthcare agent. I discussed the limitations, risks, security, and privacy concerns of performing an evaluation and management service by telephone and the potential availability of an in-person appointment in the future. The patient expressed understanding and agreed to proceed.  Location of Patient: Home Location of Provider (nurse):  In the office.  Subjective:    Jonathan Rivera is a 47 y.o. male patient of Jonathan Coombe, DO who had a Medicare Annual Wellness Visit today via telephone. Jonathan Rivera is Retired and lives alone. he does not have any children. he reports that he is socially active and does interact with friends/family regularly. he is moderately physically active and enjoys playing board games and volunteering.  Patient Care Team: Jonathan Coombe, DO as PCP - General (Family Medicine)     08/20/2022   10:00 AM 08/14/2021   10:44 AM 03/18/2020    4:02 PM  Advanced Directives  Does Patient Have a Medical Advance Directive? No No Yes  Type of Advance Directive   Living will  Does patient want to make changes to medical advance directive?   No - Patient declined  Would patient like information on creating a medical advance directive? No - Patient declined No - Patient declined     Hospital Utilization Over the Past 12 Months: # of hospitalizations or ER visits: 0 # of surgeries: 0  Review of Systems    Patient reports that his overall health is unchanged compared to last year.  History obtained from chart review and the patient  Patient Reported Readings (BP, Pulse, CBG, Weight, etc) none  Pain Assessment Pain : No/denies pain     Current Medications &  Allergies (verified) Allergies as of 08/20/2022   No Known Allergies      Medication List        Accurate as of August 20, 2022 10:11 AM. If you have any questions, ask your nurse or doctor.          Accu-Chek Guide test strip Generic drug: glucose blood USE UP TO 4 TIMES DAILY AS DIRECTED   blood glucose meter kit and supplies Kit Dispense based on patient and insurance preference. Use up to four times daily as directed. Please include lancets, test strips, control solution.   losartan 50 MG tablet Commonly known as: COZAAR Take 1 tablet (50 mg total) by mouth daily.   Semaglutide (1 MG/DOSE) 4 MG/3ML Sopn Inject 1 mg as directed once a week.        History (reviewed): Past Medical History:  Diagnosis Date   CP (cerebral palsy) (HCC) 10/09/2013   Fine motor, gross motor, expressive language.    Past Surgical History:  Procedure Laterality Date   FASCIAL DEFECT REPAIR     Family History  Problem Relation Age of Onset   Stroke Mother    Diabetes Father    Social History   Socioeconomic History   Marital status: Single    Spouse name: Not on file   Number of children: 0   Years of education: 34   Highest education level: Master's degree (e.g., MA, MS, MEng, MEd, MSW, MBA)  Occupational History   Occupation: Disabled  Tobacco Use   Smoking status: Never   Smokeless tobacco:  Never  Vaping Use   Vaping Use: Never used  Substance and Sexual Activity   Alcohol use: Not Currently   Drug use: No   Sexual activity: Yes    Partners: Female    Birth control/protection: None  Other Topics Concern   Not on file  Social History Narrative   Lives alone. Play board games. 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   Social Determinants of Health   Financial Resource Strain: Medium Risk (08/20/2022)   Overall Financial Resource Strain (CARDIA)    Difficulty of Paying Living Expenses: Somewhat hard  Food Insecurity: Food  Insecurity Present (08/20/2022)   Hunger Vital Sign    Worried About Running Out of Food in the Last Year: Sometimes true    Ran Out of Food in the Last Year: Sometimes true  Transportation Needs: No Transportation Needs (08/20/2022)   PRAPARE - Administrator, Civil Service (Medical): No    Lack of Transportation (Non-Medical): No  Physical Activity: Insufficiently Active (08/20/2022)   Exercise Vital Sign    Days of Exercise per Week: 2 days    Minutes of Exercise per Session: 60 min  Stress: No Stress Concern Present (08/20/2022)   [employer] of Occupational Health - Occupational Stress Questionnaire    Feeling of Stress : Not at all  Social Connections: Moderately Isolated (08/20/2022)   Social Connection and Isolation Panel [NHANES]    Frequency of Communication with Friends and Family: More than three times a week    Frequency of Social Gatherings with Friends and Family: More than three times a week    Attends Religious Services: Never    Database administrator or Organizations: Yes    Attends Engineer, structural: More than 4 times per year    Marital Status: Never married    Activities of Daily Living    08/20/2022   10:04 AM  In your present state of health, do you have any difficulty performing the following activities:  Hearing? 0  Vision? 0  Difficulty concentrating or making decisions? 1  Comment occasionally  Walking or climbing stairs? 0  Dressing or bathing? 0  Doing errands, shopping? 0  Preparing Food and eating ? N  Using the Toilet? N  In the past six months, have you accidently leaked urine? N  Do you have problems with loss of bowel control? N  Managing your Medications? N  Managing your Finances? N  Housekeeping or managing your Housekeeping? N    Patient Education/ Literacy How often do you need to have someone help you when you read instructions, pamphlets, or other written materials from your doctor or pharmacy?: 2 -  Rarely What is the last grade level you completed in school?: Masters degree  Exercise    Diet Patient reports consuming 2 meals a day and 0 snack(s) a day Patient reports that his primary diet is: Regular Patient reports that she does have regular access to food.   Depression Screen    08/20/2022   10:00 AM 09/05/2021    3:58 PM 08/14/2021   10:46 AM 04/04/2021    1:51 PM 03/18/2020    4:04 PM 01/16/2018    2:26 PM 10/03/2017    1:48 PM  PHQ 2/9 Scores  PHQ - 2 Score 0 2 6 0 0 0 0  PHQ- 9 Score  3 16  0  0     Fall Risk    08/20/2022  10:00 AM 07/09/2022    3:07 PM 08/14/2021   10:46 AM 08/14/2021   12:25 AM 04/04/2021    1:51 PM  Fall Risk   Falls in the past year? 0 0  0 0  Number falls in past yr: 0 0  0 0  Injury with Fall? 0 0  0 0  Risk for fall due to : No Fall Risks No Fall Risks No Fall Risks  No Fall Risks  Follow up Falls evaluation completed Falls evaluation completed Falls evaluation completed  Falls evaluation completed     Objective:  Jonathan Rivera seemed alert and oriented and he participated appropriately during our telephone visit.  Blood Pressure Weight BMI  BP Readings from Last 3 Encounters:  07/09/22 129/83  03/07/22 111/76  09/05/21 (!) 149/79   Wt Readings from Last 3 Encounters:  07/09/22 147 lb (66.7 kg)  03/07/22 154 lb (69.9 kg)  09/05/21 162 lb (73.5 kg)   BMI Readings from Last 1 Encounters:  07/09/22 23.73 kg/m    *Unable to obtain current vital signs, weight, and BMI due to telephone visit type  Hearing/Vision  Hillard did not seem to have difficulty with hearing/understanding during the telephone conversation Reports that he has had a formal eye exam by an eye care professional within the past year Reports that he has not had a formal hearing evaluation within the past year *Unable to fully assess hearing and vision during telephone visit type  Cognitive Function:    08/20/2022   10:06 AM 08/14/2021   10:52 AM 03/18/2020    4:07  PM  6CIT Screen  What Year? 0 points 0 points 0 points  What month? 0 points 0 points 0 points  What time? 0 points 0 points 0 points  Count back from 20 0 points 0 points 0 points  Months in reverse 0 points 0 points 0 points  Repeat phrase 2 points 2 points 0 points  Total Score 2 points 2 points 0 points   (Normal:0-7, Significant for Dysfunction: >8)  Normal Cognitive Function Screening: Yes   Immunization & Health Maintenance Record Immunization History  Administered Date(s) Administered   Tdap 02/27/2016    Health Maintenance  Topic Date Due   Diabetic kidney evaluation - Urine ACR  09/06/2022   COVID-19 Vaccine (1) 03/24/2023 (Originally 06/01/1976)   Fecal DNA (Cologuard)  08/20/2023 (Originally 12/01/2021)   INFLUENZA VACCINE  09/27/2022   OPHTHALMOLOGY EXAM  11/03/2022   HEMOGLOBIN A1C  01/09/2023   Diabetic kidney evaluation - eGFR measurement  03/08/2023   FOOT EXAM  07/09/2023   Medicare Annual Wellness (AWV)  08/20/2023   DTaP/Tdap/Td (2 - Td or Tdap) 02/26/2026   Hepatitis C Screening  Completed   HIV Screening  Completed   HPV VACCINES  Aged Out       Assessment  This is a routine wellness examination for Jonathan Rivera.  Health Maintenance: Due or Overdue Health Maintenance Due  Topic Date Due   Diabetic kidney evaluation - Urine ACR  09/06/2022    Jonathan Rivera does not need a referral for Community Assistance: Care Management:   no Social Work:    no Prescription Assistance:  no Nutrition/Diabetes Education:  no   Plan:  Personalized Goals  Goals Addressed               This Visit's Progress     Patient Stated (pt-stated)        08/20/2022 AWV Goal: Diabetes Management  Patient will maintain an A1C level below 8.0 Patient will not develop any diabetic foot complications Patient will not experience any hypoglycemic episodes over the next 3 months Patient will notify our office of any CBG readings outside of the provider recommended  range by calling [phone] Patient will adhere to provider recommendations for diabetes management  Patient Self Management Activities take all medications as prescribed and report any negative side effects monitor and record blood sugar readings as directed adhere to a low carbohydrate diet that incorporates lean proteins, vegetables, whole grains, low glycemic fruits check feet daily noting any sores, cracks, injuries, or callous formations see PCP or podiatrist if he notices any changes in his legs, feet, or toenails Patient will visit PCP and have an A1C level checked every 3 to 6 months as directed  have a yearly eye exam to monitor for vascular changes associated with diabetes and will request that the report be sent to his pcp.  consult with his PCP regarding any changes in his health or new or worsening symptoms        Personalized Health Maintenance & Screening Recommendations  Colorectal cancer screening - cologuard  Lung Cancer Screening Recommended: no (Low Dose CT Chest recommended if Age 1-80 years, 20 pack-year currently smoking OR have quit w/in past 15 years) Hepatitis C Screening recommended: no HIV Screening recommended: no  Advanced Directives: Written information was not prepared per patient's request.  Referrals & Orders Orders Placed This Encounter  Procedures   Cologuard   AMB Referral to Community Care Coordinaton ([facility] Patients)    Follow-up Plan Follow-up with Jonathan Coombe, DO as planned Medicare wellness visit in one year.  Patient will access AVS on my chart.   I have personally reviewed and noted the following in the patient's chart:   Medical and social history Use of alcohol, tobacco or illicit drugs  Current medications and supplements Functional ability and status Nutritional status Physical activity Advanced directives List of other physicians Hospitalizations, surgeries, and ER visits in previous 12 months Vitals Screenings to  include cognitive, depression, and falls Referrals and appointments  In addition, I have reviewed and discussed with Jonathan Rivera certain preventive protocols, quality metrics, and best practice recommendations. A written personalized care plan for preventive services as well as general preventive health recommendations is available and can be mailed to the patient at his request.      Modesto Charon, RN BSN  08/20/2022

## 2022-08-20 NOTE — Patient Instructions (Signed)
MEDICARE ANNUAL WELLNESS VISIT Health Maintenance Summary and Written Plan of Care  [name] ,  Thank you for allowing me to perform your Medicare Annual Wellness Visit and for your ongoing commitment to your health.   Health Maintenance & Immunization History Health Maintenance  Topic Date Due   Diabetic kidney evaluation - Urine ACR  09/06/2022   COVID-19 Vaccine (1) 03/24/2023 (Originally 06/01/1976)   Fecal DNA (Cologuard)  08/20/2023 (Originally 12/01/2021)   INFLUENZA VACCINE  09/27/2022   OPHTHALMOLOGY EXAM  11/03/2022   HEMOGLOBIN A1C  01/09/2023   Diabetic kidney evaluation - eGFR measurement  03/08/2023   FOOT EXAM  07/09/2023   Medicare Annual Wellness (AWV)  08/20/2023   DTaP/Tdap/Td (2 - Td or Tdap) 02/26/2026   Hepatitis C Screening  Completed   HIV Screening  Completed   HPV VACCINES  Aged Out   Immunization History  Administered Date(s) Administered   Tdap 02/27/2016    These are the patient goals that we discussed:  Goals Addressed               This Visit's Progress     Patient Stated (pt-stated)        08/20/2022 AWV Goal: Diabetes Management  Patient will maintain an A1C level below 8.0 Patient will not develop any diabetic foot complications Patient will not experience any hypoglycemic episodes over the next 3 months Patient will notify our office of any CBG readings outside of the provider recommended range by calling [phone] Patient will adhere to provider recommendations for diabetes management  Patient Self Management Activities take all medications as prescribed and report any negative side effects monitor and record blood sugar readings as directed adhere to a low carbohydrate diet that incorporates lean proteins, vegetables, whole grains, low glycemic fruits check feet daily noting any sores, cracks, injuries, or callous formations see PCP or podiatrist if he notices any changes in his legs, feet, or toenails Patient will visit  PCP and have an A1C level checked every 3 to 6 months as directed  have a yearly eye exam to monitor for vascular changes associated with diabetes and will request that the report be sent to his pcp.  consult with his PCP regarding any changes in his health or new or worsening symptoms          This is a list of Health Maintenance Items that are overdue or due now: Colorectal cancer screening - cologuard   Orders/Referrals Placed Today: Orders Placed This Encounter  Procedures   Cologuard   AMB Referral to Community Care Coordinaton ([facility] Patients)    Referral Priority:   Routine    Referral Type:   Consultation    Referral Reason:   Care Coordination    Number of Visits Requested:   1    (Contact our referral department at [phone] if you have not spoken with someone about your referral appointment within the next 5 days)    Follow-up Plan Follow-up with Everrett Coombe, DO as planned Medicare wellness visit in one year.  Patient will access AVS on my chart.      Health Maintenance, Male Adopting a healthy lifestyle and getting preventive care are important in promoting health and wellness. Ask your health care provider about: The right schedule for you to have regular tests and exams. Things you can do on your own to prevent diseases and keep yourself healthy. What should I know about diet, weight, and exercise? Eat a healthy diet  Eat a diet  that includes plenty of vegetables, fruits, low-fat dairy products, and lean protein. Do not eat a lot of foods that are high in solid fats, added sugars, or sodium. Maintain a healthy weight Body mass index (BMI) is a measurement that can be used to identify possible weight problems. It estimates body fat based on height and weight. Your health care provider can help determine your BMI and help you achieve or maintain a healthy weight. Get regular exercise Get regular exercise. This is one of the most important things you can do  for your health. Most adults should: Exercise for at least 150 minutes each week. The exercise should increase your heart rate and make you sweat (moderate-intensity exercise). Do strengthening exercises at least twice a week. This is in addition to the moderate-intensity exercise. Spend less time sitting. Even light physical activity can be beneficial. Watch cholesterol and blood lipids Have your blood tested for lipids and cholesterol at 46 years of age, then have this test every 5 years. You may need to have your cholesterol levels checked more often if: Your lipid or cholesterol levels are high. You are older than 46 years of age. You are at high risk for heart disease. What should I know about cancer screening? Many types of cancers can be detected early and may often be prevented. Depending on your health history and family history, you may need to have cancer screening at various ages. This may include screening for: Colorectal cancer. Prostate cancer. Skin cancer. Lung cancer. What should I know about heart disease, diabetes, and high blood pressure? Blood pressure and heart disease High blood pressure causes heart disease and increases the risk of stroke. This is more likely to develop in people who have high blood pressure readings or are overweight. Talk with your health care provider about your target blood pressure readings. Have your blood pressure checked: Every 3-5 years if you are 67-58 years of age. Every year if you are 59 years old or older. If you are between the ages of 79 and 16 and are a current or former smoker, ask your health care provider if you should have a one-time screening for abdominal aortic aneurysm (AAA). Diabetes Have regular diabetes screenings. This checks your fasting blood sugar level. Have the screening done: Once every three years after age 46 if you are at a normal weight and have a low risk for diabetes. More often and at a younger age if you  are overweight or have a high risk for diabetes. What should I know about preventing infection? Hepatitis B If you have a higher risk for hepatitis B, you should be screened for this virus. Talk with your health care provider to find out if you are at risk for hepatitis B infection. Hepatitis C Blood testing is recommended for: Everyone born from [birth year] through 1964. Anyone with known risk factors for hepatitis C. Sexually transmitted infections (STIs) You should be screened each year for STIs, including gonorrhea and chlamydia, if: You are sexually active and are younger than 46 years of age. You are older than 46 years of age and your health care provider tells you that you are at risk for this type of infection. Your sexual activity has changed since you were last screened, and you are at increased risk for chlamydia or gonorrhea. Ask your health care provider if you are at risk. Ask your health care provider about whether you are at high risk for HIV. Your health care provider may recommend  a prescription medicine to help prevent HIV infection. If you choose to take medicine to prevent HIV, you should first get tested for HIV. You should then be tested every 3 months for as long as you are taking the medicine. Follow these instructions at home: Alcohol use Do not drink alcohol if your health care provider tells you not to drink. If you drink alcohol: Limit how much you have to 0-2 drinks a day. Know how much alcohol is in your drink. In the [place] one drink equals one 12 oz bottle of beer (355 mL), one 5 oz glass of wine (148 mL), or one 1 oz glass of hard liquor (44 mL). Lifestyle Do not use any products that contain nicotine or tobacco. These products include cigarettes, chewing tobacco, and vaping devices, such as e-cigarettes. If you need help quitting, ask your health care provider. Do not use street drugs. Do not share needles. Ask your health care provider for help if you need  support or information about quitting drugs. General instructions Schedule regular health, dental, and eye exams. Stay current with your vaccines. Tell your health care provider if: You often feel depressed. You have ever been abused or do not feel safe at home. Summary Adopting a healthy lifestyle and getting preventive care are important in promoting health and wellness. Follow your health care provider's instructions about healthy diet, exercising, and getting tested or screened for diseases. Follow your health care provider's instructions on monitoring your cholesterol and blood pressure. This information is not intended to replace advice given to you by your health care provider. Make sure you discuss any questions you have with your health care provider. Document Revised: 07/04/2020 Document Reviewed: 07/04/2020 Elsevier Patient Education  2024 [employer].

## 2022-08-20 NOTE — Telephone Encounter (Signed)
   Telephone encounter was:  Successful.  08/20/2022 Name: JSHAWN HURTA MRN: [id number] DOB: 07-31-1975  Vaughan Browner is a 47 y.o. year old male who is a primary care patient of Everrett Coombe, DO . The community resource team was consulted for assistance with Financial Strain  Care guide performed the following interventions: Patient provided with information about care guide support team and interviewed to confirm resource needs.Patient is disabled and havinf financial strain and cant afford to pay all of his bills and buy food. Patient has no transportation. I have mailed, emailed and added referrals in [facility] and to [facility]   Follow Up Plan:  No further follow up planned at this time. The patient has been provided with needed resources.    Lenard Forth [facility] Guide, [facility] [phone] [address] [place], [place], [place] [address] Phone: [phone] Email: [email or web address]

## 2022-10-08 ENCOUNTER — Other Ambulatory Visit: Payer: Self-pay | Admitting: Family Medicine

## 2022-10-08 DIAGNOSIS — R809 Proteinuria, unspecified: Secondary | ICD-10-CM

## 2022-11-13 ENCOUNTER — Ambulatory Visit: Payer: Medicare HMO | Admitting: Family Medicine

## 2023-01-07 ENCOUNTER — Ambulatory Visit (INDEPENDENT_AMBULATORY_CARE_PROVIDER_SITE_OTHER): Payer: Medicare HMO | Admitting: Family Medicine

## 2023-01-07 ENCOUNTER — Encounter: Payer: Self-pay | Admitting: Family Medicine

## 2023-01-07 VITALS — BP 118/82 | HR 85 | Temp 97.7°F | Ht 67.0 in | Wt 149.1 lb

## 2023-01-07 DIAGNOSIS — I10 Essential (primary) hypertension: Secondary | ICD-10-CM

## 2023-01-07 DIAGNOSIS — Z7985 Long-term (current) use of injectable non-insulin antidiabetic drugs: Secondary | ICD-10-CM | POA: Diagnosis not present

## 2023-01-07 DIAGNOSIS — R809 Proteinuria, unspecified: Secondary | ICD-10-CM

## 2023-01-07 DIAGNOSIS — E1129 Type 2 diabetes mellitus with other diabetic kidney complication: Secondary | ICD-10-CM

## 2023-01-07 LAB — POCT UA - MICROALBUMIN
Albumin/Creatinine Ratio, Urine, POC: <30
Creatinine, POC: 200 mg/dL
Microalbumin Ur, POC: 30 mg/L

## 2023-01-07 LAB — POCT GLYCOSYLATED HEMOGLOBIN (HGB A1C): Hemoglobin A1C: 6.8 % — AB (ref 4.0–5.6)

## 2023-01-07 NOTE — Assessment & Plan Note (Signed)
Diabetes is fairly well controlled.  Continue Ozempic at 1 mg weekly.  Encouraged continued dietary change.

## 2023-01-07 NOTE — Assessment & Plan Note (Signed)
Blood pressure is well-controlled.  Recommend continuation of losartan at current strength.

## 2023-01-07 NOTE — Progress Notes (Signed)
[name] - 47 y.o. male MRN [id number]  Date of birth: 1975/08/22  Subjective Chief Complaint  Patient presents with   Medical Management of Chronic Issues    HPI Jonathan Rivera is a 47 y.o. male here today for follow up visit.   Reports that he is doing well. Feels great.  Has a new job.     Remains on ozempic for management of diabetes.  Blood sugars have remained pretty well controlled.  A1c today is 6.8%.  Overall he is tolerating this well.  Weight is stable.    BP is well controlled as well.  Remains on losartan.  Denies side effects from this.  He has not had chest pain, shortness of breath, palpitations, headache or vision changes.   ROS:  A comprehensive ROS was completed and negative except as noted per HPI  No Known Allergies  Past Medical History:  Diagnosis Date   CP (cerebral palsy) (HCC) 10/09/2013   Fine motor, gross motor, expressive language.     Past Surgical History:  Procedure Laterality Date   FASCIAL DEFECT REPAIR      Social History   Socioeconomic History   Marital status: Single    Spouse name: Not on file   Number of children: 0   Years of education: 46   Highest education level: Master's degree (e.g., MA, MS, MEng, MEd, MSW, MBA)  Occupational History   Occupation: Disabled  Tobacco Use   Smoking status: Never   Smokeless tobacco: Never  Vaping Use   Vaping status: Never Used  Substance and Sexual Activity   Alcohol use: Not Currently   Drug use: No   Sexual activity: Yes    Partners: Female    Birth control/protection: None  Other Topics Concern   Not on file  Social History Narrative   Lives alone. Play board games. 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   Social Determinants of Health   Financial Resource Strain: Low Risk  (01/03/2023)   Overall Financial Resource Strain (CARDIA)    Difficulty of Paying Living Expenses: Not hard at all  Food Insecurity: Food Insecurity Present  (01/03/2023)   Hunger Vital Sign    Worried About Running Out of Food in the Last Year: Sometimes true    Ran Out of Food in the Last Year: Sometimes true  Transportation Needs: No Transportation Needs (01/03/2023)   PRAPARE - Administrator, Civil Service (Medical): No    Lack of Transportation (Non-Medical): No  Physical Activity: Sufficiently Active (01/03/2023)   Exercise Vital Sign    Days of Exercise per Week: 7 days    Minutes of Exercise per Session: 60 min  Stress: No Stress Concern Present (01/03/2023)   [employer] of Occupational Health - Occupational Stress Questionnaire    Feeling of Stress : Not at all  Social Connections: Moderately Isolated (01/03/2023)   Social Connection and Isolation Panel [NHANES]    Frequency of Communication with Friends and Family: More than three times a week    Frequency of Social Gatherings with Friends and Family: More than three times a week    Attends Religious Services: Never    Database administrator or Organizations: Yes    Attends Engineer, structural: More than 4 times per year    Marital Status: Never married    Family History  Problem Relation Age of Onset   Stroke Mother    Diabetes Father  Health Maintenance  Topic Date Due   Diabetic kidney evaluation - Urine ACR  09/06/2022   OPHTHALMOLOGY EXAM  11/03/2022   COVID-19 Vaccine (1 - 2023-24 season) 01/23/2023 (Originally 10/28/2022)   INFLUENZA VACCINE  05/27/2023 (Originally 09/27/2022)   Fecal DNA (Cologuard)  08/20/2023 (Originally 12/01/2021)   HEMOGLOBIN A1C  01/09/2023   Diabetic kidney evaluation - eGFR measurement  03/08/2023   FOOT EXAM  07/09/2023   Medicare Annual Wellness (AWV)  08/20/2023   DTaP/Tdap/Td (2 - Td or Tdap) 02/26/2026   Hepatitis C Screening  Completed   HIV Screening  Completed   HPV VACCINES  Aged Out      ----------------------------------------------------------------------------------------------------------------------------------------------------------------------------------------------------------------- Physical Exam BP 118/82 (BP Location: Left Arm, Patient Position: Sitting, Cuff Size: Normal)   Pulse 85   Temp 97.7 F (36.5 C) (Oral)   Ht 5\' 7"  (1.702 m)   Wt 149 lb 1.3 oz (67.6 kg)   SpO2 100%   BMI 23.35 kg/m   Physical Exam Constitutional:      Appearance: Normal appearance.  HENT:     Head: Normocephalic and atraumatic.  Cardiovascular:     Rate and Rhythm: Normal rate and regular rhythm.  Pulmonary:     Effort: Pulmonary effort is normal.     Breath sounds: Normal breath sounds.  Musculoskeletal:     Cervical back: Neck supple.  Neurological:     General: No focal deficit present.     Mental Status: He is alert.  Psychiatric:        Mood and Affect: Mood normal.        Behavior: Behavior normal.     ------------------------------------------------------------------------------------------------------------------------------------------------------------------------------------------------------------------- Assessment and Plan  Essential hypertension Blood pressure is well-controlled.  Recommend continuation of losartan at current strength.  Type 2 diabetes mellitus with microalbuminuria (HCC) Diabetes is fairly well controlled.  Continue Ozempic at 1 mg weekly.  Encouraged continued dietary change.   No orders of the defined types were placed in this encounter.   No follow-ups on file.    This visit occurred during the SARS-CoV-2 public health emergency.  Safety protocols were in place, including screening questions prior to the visit, additional usage of staff PPE, and extensive cleaning of exam room while observing appropriate contact time as indicated for disinfecting solutions.

## 2023-07-08 ENCOUNTER — Ambulatory Visit: Payer: Medicare HMO | Admitting: Family Medicine

## 2023-07-15 ENCOUNTER — Encounter: Payer: Self-pay | Admitting: Family Medicine

## 2023-07-15 ENCOUNTER — Ambulatory Visit: Admitting: Family Medicine

## 2023-07-15 VITALS — BP 132/88 | HR 95 | Ht 67.0 in | Wt 149.0 lb

## 2023-07-15 DIAGNOSIS — I10 Essential (primary) hypertension: Secondary | ICD-10-CM | POA: Diagnosis not present

## 2023-07-15 DIAGNOSIS — E559 Vitamin D deficiency, unspecified: Secondary | ICD-10-CM

## 2023-07-15 DIAGNOSIS — E1129 Type 2 diabetes mellitus with other diabetic kidney complication: Secondary | ICD-10-CM | POA: Diagnosis not present

## 2023-07-15 DIAGNOSIS — Z7985 Long-term (current) use of injectable non-insulin antidiabetic drugs: Secondary | ICD-10-CM

## 2023-07-15 DIAGNOSIS — R5383 Other fatigue: Secondary | ICD-10-CM

## 2023-07-15 DIAGNOSIS — R809 Proteinuria, unspecified: Secondary | ICD-10-CM | POA: Diagnosis not present

## 2023-07-15 LAB — POCT GLYCOSYLATED HEMOGLOBIN (HGB A1C): HbA1c, POC (controlled diabetic range): 7.4 % — AB (ref 0.0–7.0)

## 2023-07-15 MED ORDER — LOSARTAN POTASSIUM 50 MG PO TABS: 50.0000 mg | ORAL_TABLET | Freq: Every day | ORAL | 3 refills | Status: AC

## 2023-07-15 NOTE — Progress Notes (Signed)
 Jonathan Rivera - 48 y.o. male MRN [id number]  Date of birth: [birth year]  Subjective Chief Complaint  Patient presents with   Diabetes   Fatigue   Weight Loss    HPI Jonathan Rivera is a 48 y.o. male here today for follow up.   He has history of T2DM currently managed with Ozempic  1mg  weekly.  He reports that he is doing pretty well with this.  He does report some ongoing fatigue.  Concerned about low testosterone  contributing to this.  A1c is increased slightly since last time.  He denies any side effects from Ozempic  at current strength.  Blood pressure mains well-controlled with losartan .  No side effects at this time.  Denies chest pain, shortness of breath, palpitations headache or vision changes.    ROS:  A comprehensive ROS was completed and negative except as noted per HPI  No Known Allergies  Past Medical History:  Diagnosis Date   CP (cerebral palsy) (HCC) 10/09/2013   Fine motor, gross motor, expressive language.     Past Surgical History:  Procedure Laterality Date   FASCIAL DEFECT REPAIR      Social History   Socioeconomic History   Marital status: Single    Spouse name: Not on file   Number of children: 0   Years of education: 2   Highest education level: Master's degree (e.g., MA, MS, MEng, MEd, MSW, MBA)  Occupational History   Occupation: Disabled  Tobacco Use   Smoking status: Never   Smokeless tobacco: Never  Vaping Use   Vaping status: Never Used  Substance and Sexual Activity   Alcohol use: Not Currently   Drug use: No   Sexual activity: Yes    Partners: Female    Birth control/protection: None  Other Topics Concern   Not on file  Social History Narrative   Lives alone. Play board games. 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   Social Drivers of Corporate investment banker Strain: Low Risk  (07/11/2023)   Overall Financial Resource Strain (CARDIA)    Difficulty of Paying Living Expenses: Not very  hard  Food Insecurity: Food Insecurity Present (07/11/2023)   Hunger Vital Sign    Worried About Running Out of Food in the Last Year: Sometimes true    Ran Out of Food in the Last Year: Never true  Transportation Needs: No Transportation Needs (07/11/2023)   PRAPARE - Administrator, Civil Service (Medical): No    Lack of Transportation (Non-Medical): No  Physical Activity: Inactive (07/11/2023)   Exercise Vital Sign    Days of Exercise per Week: 0 days    Minutes of Exercise per Session: 60 min  Stress: No Stress Concern Present (07/11/2023)   [employer] of Occupational Health - Occupational Stress Questionnaire    Feeling of Stress : Not at all  Social Connections: Moderately Isolated (07/11/2023)   Social Connection and Isolation Panel [NHANES]    Frequency of Communication with Friends and Family: More than three times a week    Frequency of Social Gatherings with Friends and Family: Once a week    Attends Religious Services: Never    Database administrator or Organizations: No    Attends Engineer, structural: More than 4 times per year    Marital Status: Never married    Family History  Problem Relation Age of Onset   Stroke Mother    Diabetes Father  Health Maintenance  Topic Date Due   Pneumococcal Vaccine 56-43 Years old (1 of 2 - PCV) Never done   COVID-19 Vaccine (1 - 2024-25 season) Never done   OPHTHALMOLOGY EXAM  11/03/2022   Diabetic kidney evaluation - eGFR measurement  03/08/2023   FOOT EXAM  07/09/2023   Medicare Annual Wellness (AWV)  08/20/2023   Fecal DNA (Cologuard)  08/20/2023 (Originally 12/01/2021)   INFLUENZA VACCINE  09/27/2023   Diabetic kidney evaluation - Urine ACR  01/07/2024   HEMOGLOBIN A1C  01/15/2024   DTaP/Tdap/Td (2 - Td or Tdap) 02/26/2026   Hepatitis C Screening  Completed   HIV Screening  Completed   HPV VACCINES  Aged Out   Meningococcal B Vaccine  Aged Out      ----------------------------------------------------------------------------------------------------------------------------------------------------------------------------------------------------------------- Physical Exam BP 132/88 (BP Location: Left Arm, Patient Position: Sitting, Cuff Size: Small)   Pulse 95   Ht 5\' 7"  (1.702 m)   Wt 149 lb (67.6 kg)   SpO2 98%   BMI 23.34 kg/m   Physical Exam Constitutional:      Appearance: Normal appearance.  Eyes:     General: No scleral icterus. Cardiovascular:     Rate and Rhythm: Normal rate and regular rhythm.  Pulmonary:     Effort: Pulmonary effort is normal.     Breath sounds: Normal breath sounds.  Musculoskeletal:     Cervical back: Neck supple.  Neurological:     Mental Status: He is alert.  Psychiatric:        Mood and Affect: Mood normal.        Behavior: Behavior normal.     ------------------------------------------------------------------------------------------------------------------------------------------------------------------------------------------------------------------- Assessment and Plan  Type 2 diabetes mellitus with microalbuminuria (HCC) A1c has increased to.  Encourage dietary change.  Continue Ozempic  at 1 mg.  Updated labs ordered.  Essential hypertension Blood pressure is well-controlled.  Recommend continuation of losartan  at current strength.   Meds ordered this encounter  Medications   losartan  (COZAAR ) 50 MG tablet    Sig: Take 1 tablet (50 mg total) by mouth daily.    Dispense:  90 tablet    Refill:  3    Return in about 6 months (around 01/15/2024) for Type 2 Diabetes, Hypertension.

## 2023-07-15 NOTE — Assessment & Plan Note (Signed)
Blood pressure is well-controlled.  Recommend continuation of losartan at current strength.

## 2023-07-15 NOTE — Assessment & Plan Note (Signed)
 A1c has increased to.  Encourage dietary change.  Continue Ozempic  at 1 mg.  Updated labs ordered.

## 2023-09-26 ENCOUNTER — Encounter: Payer: Self-pay | Admitting: Medical-Surgical

## 2023-11-29 NOTE — Progress Notes (Signed)
 WESTIN KNOTTS                                          MRN: [id number]   11/29/2023   The [facility] Quality Team Specialist reviewed this patient medical record for the purposes of chart review for care gap closure. The following were reviewed: chart review for care gap closure-kidney health evaluation for diabetes:eGFR  and uACR.    [facility] Quality Team

## 2023-12-05 ENCOUNTER — Telehealth: Payer: Self-pay

## 2023-12-05 NOTE — Telephone Encounter (Signed)
 Patient declined Cologuard.

## 2023-12-08 DIAGNOSIS — Z833 Family history of diabetes mellitus: Secondary | ICD-10-CM | POA: Diagnosis not present

## 2023-12-08 DIAGNOSIS — N529 Male erectile dysfunction, unspecified: Secondary | ICD-10-CM | POA: Diagnosis not present

## 2023-12-08 DIAGNOSIS — Z7985 Long-term (current) use of injectable non-insulin antidiabetic drugs: Secondary | ICD-10-CM | POA: Diagnosis not present

## 2023-12-08 DIAGNOSIS — E119 Type 2 diabetes mellitus without complications: Secondary | ICD-10-CM | POA: Diagnosis not present

## 2023-12-08 DIAGNOSIS — Z809 Family history of malignant neoplasm, unspecified: Secondary | ICD-10-CM | POA: Diagnosis not present

## 2023-12-08 DIAGNOSIS — I10 Essential (primary) hypertension: Secondary | ICD-10-CM | POA: Diagnosis not present

## 2023-12-08 DIAGNOSIS — M1990 Unspecified osteoarthritis, unspecified site: Secondary | ICD-10-CM | POA: Diagnosis not present

## 2023-12-08 DIAGNOSIS — Z8249 Family history of ischemic heart disease and other diseases of the circulatory system: Secondary | ICD-10-CM | POA: Diagnosis not present

## 2023-12-20 ENCOUNTER — Other Ambulatory Visit: Payer: Self-pay | Admitting: Family Medicine

## 2023-12-20 DIAGNOSIS — E1129 Type 2 diabetes mellitus with other diabetic kidney complication: Secondary | ICD-10-CM

## 2024-01-13 ENCOUNTER — Encounter: Payer: Self-pay | Admitting: Family Medicine

## 2024-01-13 ENCOUNTER — Ambulatory Visit: Admitting: Family Medicine

## 2024-01-13 VITALS — BP 138/84 | HR 91 | Ht 67.0 in | Wt 152.0 lb

## 2024-01-13 DIAGNOSIS — Z7985 Long-term (current) use of injectable non-insulin antidiabetic drugs: Secondary | ICD-10-CM

## 2024-01-13 DIAGNOSIS — R809 Proteinuria, unspecified: Secondary | ICD-10-CM

## 2024-01-13 DIAGNOSIS — G809 Cerebral palsy, unspecified: Secondary | ICD-10-CM | POA: Diagnosis not present

## 2024-01-13 DIAGNOSIS — I10 Essential (primary) hypertension: Secondary | ICD-10-CM | POA: Diagnosis not present

## 2024-01-13 DIAGNOSIS — E559 Vitamin D deficiency, unspecified: Secondary | ICD-10-CM | POA: Diagnosis not present

## 2024-01-13 DIAGNOSIS — Z1211 Encounter for screening for malignant neoplasm of colon: Secondary | ICD-10-CM | POA: Diagnosis not present

## 2024-01-13 DIAGNOSIS — E1129 Type 2 diabetes mellitus with other diabetic kidney complication: Secondary | ICD-10-CM

## 2024-01-13 DIAGNOSIS — R5382 Chronic fatigue, unspecified: Secondary | ICD-10-CM | POA: Diagnosis not present

## 2024-01-13 LAB — POCT GLYCOSYLATED HEMOGLOBIN (HGB A1C): HbA1c, POC (controlled diabetic range): 7.6 % — AB (ref 0.0–7.0)

## 2024-01-13 LAB — POCT UA - MICROALBUMIN
Albumin/Creatinine Ratio, Urine, POC: <30
Creatinine, POC: 300 mg/dL
Microalbumin Ur, POC: 30 mg/L

## 2024-01-13 NOTE — Assessment & Plan Note (Signed)
 Orders Placed This Encounter  Procedures   Cologuard   CBC with Differential/Platelet   CMP14+EGFR   Lipid Panel With LDL/HDL Ratio   TSH   B12   Testosterone    Vitamin D  (25 hydroxy)   Ambulatory referral to Ophthalmology    Referral Priority:   Routine    Referral Type:   Consultation    Referral Reason:   Specialty Services Required    Requested Specialty:   Ophthalmology    Number of Visits Requested:   1   POCT HgB A1C   POCT UA - Microalbumin

## 2024-01-13 NOTE — Assessment & Plan Note (Signed)
 A1c has increased some.  Encourage dietary change.  Continue Ozempic  at 1 mg.  Updated labs ordered.

## 2024-01-13 NOTE — Assessment & Plan Note (Signed)
Blood pressure is well-controlled.  Recommend continuation of losartan at current strength.

## 2024-01-13 NOTE — Assessment & Plan Note (Signed)
 Update vitamin d levels.

## 2024-01-13 NOTE — Assessment & Plan Note (Signed)
 Stable at this time

## 2024-01-13 NOTE — Progress Notes (Signed)
 Jonathan Rivera - 48 y.o. male MRN [id number]  Date of birth: 1975/07/24  Subjective Chief Complaint  Patient presents with   Diabetes   Hypertension    HPI Jonathan Rivera is a 48 y.o. male here today for follow up visit.   He reports that he is doing pretty well.   Remains on Ozempic  for management of diabetes.  Overall he is doing well with this. A1c is up some from previous visit.  He knows that his diet can be better and plans to work on this.    Having some mild hip and back pain.  Improves with foam roller stretches.  Denies numbness/tingling or weakness.   ROS:  A comprehensive ROS was completed and negative except as noted per HPI  No Known Allergies  Past Medical History:  Diagnosis Date   CP (cerebral palsy) (HCC) 10/09/2013   Fine motor, gross motor, expressive language.     Past Surgical History:  Procedure Laterality Date   FASCIAL DEFECT REPAIR      Social History   Socioeconomic History   Marital status: Single    Spouse name: Not on file   Number of children: 0   Years of education: 33   Highest education level: Master's degree (e.g., MA, MS, MEng, MEd, MSW, MBA)  Occupational History   Occupation: Disabled  Tobacco Use   Smoking status: Never   Smokeless tobacco: Never  Vaping Use   Vaping status: Never Used  Substance and Sexual Activity   Alcohol use: Not Currently   Drug use: No   Sexual activity: Yes    Partners: Female    Birth control/protection: None  Other Topics Concern   Not on file  Social History Narrative   Lives alone. Play board games. 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 Has 2 cats.   Social Drivers of Corporate Investment Banker Strain: Low Risk  (01/12/2024)   Overall Financial Resource Strain (CARDIA)    Difficulty of Paying Living Expenses: Not hard at all  Food Insecurity: No Food Insecurity (01/12/2024)   Hunger Vital Sign    Worried About Running Out of Food in the Last Year: Never  true    Ran Out of Food in the Last Year: Never true  Transportation Needs: No Transportation Needs (01/12/2024)   PRAPARE - Administrator, Civil Service (Medical): No    Lack of Transportation (Non-Medical): No  Physical Activity: Inactive (01/12/2024)   Exercise Vital Sign    Days of Exercise per Week: 0 days    Minutes of Exercise per Session: Not on file  Stress: No Stress Concern Present (01/12/2024)   [employer] of Occupational Health - Occupational Stress Questionnaire    Feeling of Stress: Not at all  Social Connections: Socially Isolated (01/12/2024)   Social Connection and Isolation Panel    Frequency of Communication with Friends and Family: More than three times a week    Frequency of Social Gatherings with Friends and Family: More than three times a week    Attends Religious Services: Never    Database Administrator or Organizations: No    Attends Engineer, Structural: Not on file    Marital Status: Never married    Family History  Problem Relation Age of Onset   Stroke Mother    Diabetes Father     Health Maintenance  Topic Date Due   Fecal DNA (Cologuard)  Never done   OPHTHALMOLOGY EXAM  11/03/2022   Diabetic kidney evaluation - eGFR measurement  03/08/2023   Medicare Annual Wellness (AWV)  08/20/2023   Influenza Vaccine  05/26/2024 (Originally 09/27/2023)   Pneumococcal Vaccine (1 of 2 - PCV) 01/12/2025 (Originally 12/02/1995)   Hepatitis B Vaccines 19-59 Average Risk (1 of 3 - 19+ 3-dose series) 01/12/2025 (Originally 12/02/1995)   COVID-19 Vaccine (1 - 2025-26 season) 01/28/2025 (Originally 10/28/2023)   HEMOGLOBIN A1C  07/12/2024   Diabetic kidney evaluation - Urine ACR  01/12/2025   FOOT EXAM  01/12/2025   DTaP/Tdap/Td (2 - Td or Tdap) 02/26/2026   Hepatitis C Screening  Completed   HIV Screening  Completed   HPV VACCINES  Aged Out   Meningococcal B Vaccine  Aged Out      ----------------------------------------------------------------------------------------------------------------------------------------------------------------------------------------------------------------- Physical Exam BP 138/84 (BP Location: Left Arm, Patient Position: Sitting, Cuff Size: Small)   Pulse 91   Ht 5' 7 (1.702 m)   Wt 152 lb (68.9 kg)   SpO2 99%   BMI 23.81 kg/m   Physical Exam Constitutional:      Appearance: Normal appearance.  Eyes:     General: No scleral icterus. Cardiovascular:     Rate and Rhythm: Normal rate and regular rhythm.  Pulmonary:     Effort: Pulmonary effort is normal.     Breath sounds: Normal breath sounds.  Neurological:     Mental Status: He is alert.  Psychiatric:        Mood and Affect: Mood normal.        Behavior: Behavior normal.     ------------------------------------------------------------------------------------------------------------------------------------------------------------------------------------------------------------------- Assessment and Plan  Type 2 diabetes mellitus with microalbuminuria (HCC) A1c has increased some.  Encourage dietary change.  Continue Ozempic  at 1 mg.  Updated labs ordered.  Essential hypertension Blood pressure is well-controlled.  Recommend continuation of losartan  at current strength.  CP (cerebral palsy) (HCC) Stable at this time.   Vitamin D  deficiency Update vitamin d  levels.   Chronic fatigue Orders Placed This Encounter  Procedures   Cologuard   CBC with Differential/Platelet   CMP14+EGFR   Lipid Panel With LDL/HDL Ratio   TSH   B12   Testosterone    Vitamin D  (25 hydroxy)   Ambulatory referral to Ophthalmology    Referral Priority:   Routine    Referral Type:   Consultation    Referral Reason:   Specialty Services Required    Requested Specialty:   Ophthalmology    Number of Visits Requested:   1   POCT HgB A1C   POCT UA - Microalbumin     No orders  of the defined types were placed in this encounter.   Return in about 6 months (around 07/12/2024) for Type 2 Diabetes.

## 2024-01-14 ENCOUNTER — Ambulatory Visit: Payer: Self-pay | Admitting: Family Medicine

## 2024-01-14 LAB — CBC WITH DIFFERENTIAL/PLATELET
Basophils Absolute: 0 x10E3/uL (ref 0.0–0.2)
Basos: 0 %
EOS (ABSOLUTE): 0.1 x10E3/uL (ref 0.0–0.4)
Eos: 1 %
Hematocrit: 43.8 % (ref 37.5–51.0)
Hemoglobin: 14.2 g/dL (ref 13.0–17.7)
Immature Grans (Abs): 0 x10E3/uL (ref 0.0–0.1)
Immature Granulocytes: 0 %
Lymphocytes Absolute: 2.1 x10E3/uL (ref 0.7–3.1)
Lymphs: 21 %
MCH: 28.9 pg (ref 26.6–33.0)
MCHC: 32.4 g/dL (ref 31.5–35.7)
MCV: 89 fL (ref 79–97)
Monocytes Absolute: 0.7 x10E3/uL (ref 0.1–0.9)
Monocytes: 7 %
Neutrophils Absolute: 6.7 x10E3/uL (ref 1.4–7.0)
Neutrophils: 71 %
Platelets: 236 x10E3/uL (ref 150–450)
RBC: 4.92 x10E6/uL (ref 4.14–5.80)
RDW: 12.7 % (ref 11.6–15.4)
WBC: 9.6 x10E3/uL (ref 3.4–10.8)

## 2024-01-14 LAB — VITAMIN D 25 HYDROXY (VIT D DEFICIENCY, FRACTURES): Vit D, 25-Hydroxy: 19.2 ng/mL — ABNORMAL LOW (ref 30.0–100.0)

## 2024-01-14 LAB — LIPID PANEL WITH LDL/HDL RATIO
Cholesterol, Total: 150 mg/dL (ref 100–199)
HDL: 41 mg/dL (ref >39)
LDL Chol Calc (NIH): 81 mg/dL (ref 0–99)
LDL/HDL Ratio: 2 ratio (ref 0.0–3.6)
Triglycerides: 162 mg/dL — ABNORMAL HIGH (ref 0–149)
VLDL Cholesterol Cal: 28 mg/dL (ref 5–40)

## 2024-01-14 LAB — CMP14+EGFR
ALT: 37 IU/L (ref 0–44)
AST: 24 IU/L (ref 0–40)
Albumin: 4.8 g/dL (ref 4.1–5.1)
Alkaline Phosphatase: 89 IU/L (ref 47–123)
BUN/Creatinine Ratio: 14 (ref 9–20)
BUN: 13 mg/dL (ref 6–24)
Bilirubin Total: 0.8 mg/dL (ref 0.0–1.2)
CO2: 24 mmol/L (ref 20–29)
Calcium: 9.4 mg/dL (ref 8.7–10.2)
Chloride: 106 mmol/L (ref 96–106)
Creatinine, Ser: 0.91 mg/dL (ref 0.76–1.27)
Globulin, Total: 2.2 g/dL (ref 1.5–4.5)
Glucose: 117 mg/dL — ABNORMAL HIGH (ref 70–99)
Potassium: 4.1 mmol/L (ref 3.5–5.2)
Sodium: 146 mmol/L — ABNORMAL HIGH (ref 134–144)
Total Protein: 7 g/dL (ref 6.0–8.5)
eGFR: 105 mL/min/1.73 (ref >59)

## 2024-01-14 LAB — TESTOSTERONE: Testosterone: 463 ng/dL (ref 264–916)

## 2024-01-14 LAB — TSH: TSH: 1.73 u[IU]/mL (ref 0.450–4.500)

## 2024-01-14 LAB — VITAMIN B12: Vitamin B-12: 1005 pg/mL (ref 232–1245)

## 2024-01-14 MED ORDER — VITAMIN D (ERGOCALCIFEROL) 1.25 MG (50000 UNIT) PO CAPS: 50000.0000 [IU] | ORAL_CAPSULE | ORAL | 2 refills | Status: AC

## 2024-01-27 DIAGNOSIS — Z1211 Encounter for screening for malignant neoplasm of colon: Secondary | ICD-10-CM | POA: Diagnosis not present

## 2024-02-02 LAB — COLOGUARD: COLOGUARD: NEGATIVE

## 2024-02-19 ENCOUNTER — Telehealth: Payer: Self-pay

## 2024-02-19 NOTE — Progress Notes (Signed)
" ° °  02/19/2024  Patient ID: [name], male   DOB: [birth year], 48 y.o.   MRN: [id number]  This patient is appearing on a report for being at risk of failing the adherence measure for hypertension (ACEi/ARB) medications this calendar year.   Medication: losartan  50mg  Last fill date: 01/27/24 for 90 day supply  Insurance report was not up to date. No action needed at this time.   Channing DELENA Mealing, PharmD, DPLA   "

## 2024-07-13 ENCOUNTER — Ambulatory Visit: Admitting: Family Medicine
